# Patient Record
Sex: Female | Born: 2000 | Race: Black or African American | Hispanic: No | Smoking: Never smoker
Health system: Southern US, Community
[De-identification: ages and names within clinical notes are randomized; demographics above are authoritative.]

## PROBLEM LIST (undated history)

## (undated) DIAGNOSIS — Z789 Other specified health status: Secondary | ICD-10-CM

## (undated) HISTORY — PX: NO PAST SURGERIES: SHX2092

## (undated) HISTORY — DX: Other specified health status: Z78.9

---
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
## 2020-01-16 ENCOUNTER — Ambulatory Visit
Admission: EM | Admit: 2020-01-16 | Discharge: 2020-01-16 | Disposition: A | Discharge status: Home / Self Care | Payer: Medicaid Other | Attending: Physician Assistant | Admitting: Physician Assistant

## 2020-01-16 DIAGNOSIS — Z3200 Encounter for pregnancy test, result unknown: Secondary | ICD-10-CM | POA: Diagnosis not present

## 2020-01-16 DIAGNOSIS — N898 Other specified noninflammatory disorders of vagina: Secondary | ICD-10-CM

## 2020-01-16 DIAGNOSIS — R102 Pelvic and perineal pain: Secondary | ICD-10-CM | POA: Insufficient documentation

## 2020-01-16 LAB — POCT URINE PREGNANCY: Preg Test, Ur: NEGATIVE

## 2020-01-16 MED ORDER — METRONIDAZOLE 0.75 % VA GEL: 1.0000 | Freq: Every day | VAGINAL | 0 refills | Status: DC

## 2020-01-16 NOTE — ED Triage Notes (Signed)
Pt c/o white vaginal discharge with odor x2 days. Requesting STD and yeast testing. States has had un protective intercourse. Denies urinary sx's.

## 2020-01-16 NOTE — Discharge Instructions (Signed)
You were treated empirically for BV, start metrogel as directed. Cytology sent, you will be contacted with any positive results that requires further treatment. Refrain from sexual activity for the next 7 days. Monitor for any worsening of symptoms, fever, abdominal pain, nausea, vomiting, to follow up for reevaluation.

## 2020-01-16 NOTE — ED Provider Notes (Signed)
[name]    CSN: [id number] Arrival date & time: 01/16/20  1320      History   Chief Complaint Chief Complaint  Patient presents with  . SEXUALLY TRANSMITTED DISEASE    HPI Jennifer Escobar is a 19 y.o. female.   19 year old female 2 day history of low abdominal cramping, vaginal discharge with odor. Abdominal cramping is to the suprapubic area, intermittent. No vaginal itching. Denies urinary symptoms such as frequency, dysuria, hematuria. Denies fever, chills, body aches. Denies nausea, vomiting. IUD and do not have cycles. Sexually active 1 female, no condom use. No changes in body wash.      Past Medical History:  Diagnosis Date  . Antepartum fetal growth retardation 04/23/2019  . IUGR (intrauterine growth restriction) affecting care of mother 04/18/2019  . Medical history non-contributory   . Rubella non-immune status, antepartum 11/01/2018   MMR pp  . Supervision of high risk pregnancy, antepartum 10/31/2018    Nursing Staff Provider Office Location  [facility]  Dating   LMP Language  ENGLISH  Anatomy US  12/20/2018-WNL Flu Vaccine  Declined 02/13/2019 Genetic Screen  NIPS: ordered   AFP:     TDaP vaccine   02/13/2019 Hgb A1C or  GTT Early  Third trimester WNL Rhogam     LAB RESULTS  Feeding Plan Undecided, ?breast Blood Type B/Positive/-- (06/24 1058)  Contraception LnIUD PP Antibody Negative (06/24 1058) Cir  . SVD (spontaneous vaginal delivery) 04/24/2019    Patient Active Problem List   Diagnosis Date Noted  . IUD (intrauterine device) in place 04/24/2019    Past Surgical History:  Procedure Laterality Date  . NO PAST SURGERIES      OB History    Gravida  1   Para  1   Term  1   Preterm      AB      Living  1     SAB      TAB      Ectopic      Multiple  0   Live Births  1            Home Medications    Prior to Admission medications   Medication Sig Start Date End Date Taking? Authorizing Provider  Levonorgestrel (LILETTA, 52 MG,  IU) by Intrauterine route. 04/24/19   [provider]  metroNIDAZOLE (METROGEL VAGINAL) 0.75 % vaginal gel Place 1 Applicatorful vaginally at bedtime. 01/16/20   Ok Edwards, PA-C    Family History History reviewed. No pertinent family history.  Social History Social History   Tobacco Use  . Smoking status: Never Smoker  . Smokeless tobacco: Never Used  Vaping Use  . Vaping Use: Never used  Substance Use Topics  . Alcohol use: Never  . Drug use: Never     Allergies   Patient has no known allergies.   Review of Systems Review of Systems  Reason unable to perform ROS: See HPI as above.     Physical Exam Triage Vital Signs ED Triage Vitals [01/16/20 1538]  Enc Vitals Group     BP 119/76     Pulse Rate 73     Resp 18     Temp 97.8 F (36.6 C)     Temp Source Oral     SpO2 100 %     Weight      Height      Head Circumference      Peak Flow  Pain Score 2     Pain Loc      Pain Edu?      Excl. in [place]?    No data found.  Updated Vital Signs BP 119/76 (BP Location: Left Arm)   Pulse 73   Temp 97.8 F (36.6 C) (Oral)   Resp 18   SpO2 100%   Breastfeeding No   Physical Exam Constitutional:      General: She is not in acute distress.    Appearance: She is well-developed. She is not ill-appearing, toxic-appearing or diaphoretic.  HENT:     Head: Normocephalic and atraumatic.  Eyes:     Conjunctiva/sclera: Conjunctivae normal.     Pupils: Pupils are equal, round, and reactive to light.  Cardiovascular:     Rate and Rhythm: Normal rate and regular rhythm.  Pulmonary:     Effort: Pulmonary effort is normal. No respiratory distress.     Comments: LCTAB Abdominal:     General: Bowel sounds are normal.     Palpations: Abdomen is soft.     Tenderness: There is no abdominal tenderness. There is no right CVA tenderness, left CVA tenderness, guarding or rebound.  Genitourinary:    Comments: Patient declined Musculoskeletal:     Cervical back: Normal  range of motion and neck supple.  Skin:    General: Skin is warm and dry.  Neurological:     Mental Status: She is alert and oriented to person, place, and time.  Psychiatric:        Behavior: Behavior normal.        Judgment: Judgment normal.      UC Treatments / Results  Labs (all labs ordered are listed, but only abnormal results are displayed) Labs Reviewed  POCT URINE PREGNANCY  CERVICOVAGINAL ANCILLARY ONLY    EKG   Radiology No results found.  Procedures Procedures (including critical care time)  Medications Ordered in UC Medications - No data to display  Initial Impression / Assessment and Plan / UC Course  I have reviewed the triage vital signs and the nursing notes.  Pertinent labs & imaging results that were available during my care of the patient were reviewed by me and considered in my medical decision making (see chart for details).    Patient was treated empirically for BV, metrogel as directed. abdomen soft, +BS, nontender to palpation. Patient still able to feel IUD string. declined GU exam. Cytology sent, patient will be contacted with any positive results that require additional treatment. Patient to refrain from sexual activity for the next 7 days. Return precautions given.   Final Clinical Impressions(s) / UC Diagnoses   Final diagnoses:  Vaginal discharge  Suprapubic cramping    ED Prescriptions    Medication Sig Dispense Auth. Provider   metroNIDAZOLE (METROGEL VAGINAL) 0.75 % vaginal gel Place 1 Applicatorful vaginally at bedtime. 50 g Ok Edwards, PA-C     PDMP not reviewed this encounter.   Ok Edwards, PA-C 01/16/20 [phone]

## 2020-01-17 ENCOUNTER — Ambulatory Visit
Admission: EM | Admit: 2020-01-17 | Discharge: 2020-01-17 | Disposition: A | Discharge status: Home / Self Care | Payer: Medicaid Other | Attending: Emergency Medicine | Admitting: Emergency Medicine

## 2020-01-17 ENCOUNTER — Other Ambulatory Visit: Payer: Self-pay

## 2020-01-17 ENCOUNTER — Telehealth (HOSPITAL_COMMUNITY): Payer: Self-pay | Admitting: Emergency Medicine

## 2020-01-17 DIAGNOSIS — A5602 Chlamydial vulvovaginitis: Secondary | ICD-10-CM | POA: Diagnosis not present

## 2020-01-17 LAB — CERVICOVAGINAL ANCILLARY ONLY

## 2020-01-17 NOTE — Telephone Encounter (Signed)
Received call from lab on patient, states her cervicovaginal swab was received with no fluid in the bottom and they are unable to run the test.  Patient called and made aware of the need to return for a recollect.

## 2020-01-17 NOTE — ED Triage Notes (Signed)
Pt here for reswab 

## 2020-01-20 LAB — CERVICOVAGINAL ANCILLARY ONLY
Bacterial Vaginitis (gardnerella): POSITIVE — AB
Candida Glabrata: NEGATIVE
Candida Vaginitis: NEGATIVE
Chlamydia: POSITIVE — AB
Comment: NEGATIVE
Comment: NEGATIVE
Comment: NEGATIVE
Comment: NEGATIVE
Comment: NEGATIVE
Comment: NORMAL
Neisseria Gonorrhea: NEGATIVE
Trichomonas: NEGATIVE

## 2020-01-21 ENCOUNTER — Telehealth (HOSPITAL_COMMUNITY): Payer: Self-pay

## 2020-01-21 MED ORDER — AZITHROMYCIN 250 MG PO TABS: 1000.0000 mg | ORAL_TABLET | Freq: Once | ORAL | 0 refills | Status: AC

## 2020-01-29 ENCOUNTER — Ambulatory Visit
Admission: EM | Admit: 2020-01-29 | Discharge: 2020-01-29 | Disposition: A | Discharge status: Home / Self Care | Payer: Medicaid Other | Attending: Emergency Medicine | Admitting: Emergency Medicine

## 2020-01-29 ENCOUNTER — Other Ambulatory Visit: Payer: Self-pay

## 2020-01-29 DIAGNOSIS — Z113 Encounter for screening for infections with a predominantly sexual mode of transmission: Secondary | ICD-10-CM | POA: Insufficient documentation

## 2020-01-29 DIAGNOSIS — R1030 Lower abdominal pain, unspecified: Secondary | ICD-10-CM | POA: Insufficient documentation

## 2020-01-29 LAB — POCT URINE PREGNANCY: Preg Test, Ur: NEGATIVE

## 2020-01-29 MED ORDER — NAPROXEN 500 MG PO TABS: 500.0000 mg | ORAL_TABLET | Freq: Two times a day (BID) | ORAL | 0 refills | Status: DC

## 2020-01-29 NOTE — ED Provider Notes (Signed)
[name]    CSN: [id number] Arrival date & time: 01/29/20  0071      History   Chief Complaint Chief Complaint  Patient presents with  . SEXUALLY TRANSMITTED DISEASE    cramping    HPI Jennifer Escobar is a 19 y.o. female presenting today for STD screening abdominal cramping.  Reports over the past 2 days has had abdominal pain in her lower abdomen.  Denies any other symptoms including urinary symptoms or vaginal symptoms.  Has IUD in place is not has not had menstrual cycle since placement.  Concerned about possible STD as cause of symptoms.  Denies nausea or vomiting.  Has been eating and drinking normally.  HPI  Past Medical History:  Diagnosis Date  . Antepartum fetal growth retardation 04/23/2019  . IUGR (intrauterine growth restriction) affecting care of mother 04/18/2019  . Medical history non-contributory   . Rubella non-immune status, antepartum 11/01/2018   MMR pp  . Supervision of high risk pregnancy, antepartum 10/31/2018    Nursing Staff Provider Office Location  [place]  Dating   LMP Language  ENGLISH  Anatomy US  12/20/2018-WNL Flu Vaccine  Declined 02/13/2019 Genetic Screen  NIPS: ordered   AFP:     TDaP vaccine   02/13/2019 Hgb A1C or  GTT Early  Third trimester WNL Rhogam     LAB RESULTS  Feeding Plan Undecided, ?breast Blood Type B/Positive/-- (06/24 1058)  Contraception LnIUD PP Antibody Negative (06/24 1058) Cir  . SVD (spontaneous vaginal delivery) 04/24/2019    Patient Active Problem List   Diagnosis Date Noted  . IUD (intrauterine device) in place 04/24/2019    Past Surgical History:  Procedure Laterality Date  . NO PAST SURGERIES      OB History    Gravida  1   Para  1   Term  1   Preterm      AB      Living  1     SAB      TAB      Ectopic      Multiple  0   Live Births  1            Home Medications    Prior to Admission medications   Medication Sig Start Date End Date Taking? Authorizing Provider    Levonorgestrel (LILETTA, 52 MG, IU) by Intrauterine route. 04/24/19   [provider]  metroNIDAZOLE (METROGEL VAGINAL) 0.75 % vaginal gel Place 1 Applicatorful vaginally at bedtime. 01/16/20   Tasia Catchings, Amy V, PA-C  naproxen (NAPROSYN) 500 MG tablet Take 1 tablet (500 mg total) by mouth 2 (two) times daily. 01/29/20   Burna Atlas, Elesa Hacker, PA-C    Family History Family History  Problem Relation Age of Onset  . Healthy Mother   . Healthy Father     Social History Social History   Tobacco Use  . Smoking status: Never Smoker  . Smokeless tobacco: Never Used  Vaping Use  . Vaping Use: Never used  Substance Use Topics  . Alcohol use: Never  . Drug use: Never     Allergies   Patient has no known allergies.   Review of Systems Review of Systems  Constitutional: Negative for fever.  Respiratory: Negative for shortness of breath.   Cardiovascular: Negative for chest pain.  Gastrointestinal: Positive for abdominal pain. Negative for diarrhea, nausea and vomiting.  Genitourinary: Negative for dysuria, flank pain, genital sores, hematuria, menstrual problem, vaginal bleeding, vaginal discharge and vaginal pain.  Musculoskeletal: Negative for back pain.  Skin: Negative for rash.  Neurological: Negative for dizziness, light-headedness and headaches.     Physical Exam Triage Vital Signs ED Triage Vitals  Enc Vitals Group     BP      Pulse      Resp      Temp      Temp src      SpO2      Weight      Height      Head Circumference      Peak Flow      Pain Score      Pain Loc      Pain Edu?      Excl. in [place]?    No data found.  Updated Vital Signs BP 101/73 (BP Location: Left Arm)   Pulse 83   Temp 98.1 F (36.7 C) (Oral)   Resp 17   SpO2 99%   Visual Acuity Right Eye Distance:   Left Eye Distance:   Bilateral Distance:    Right Eye Near:   Left Eye Near:    Bilateral Near:     Physical Exam Vitals and nursing note reviewed.  Constitutional:       Appearance: She is well-developed.     Comments: No acute distress  HENT:     Head: Normocephalic and atraumatic.     Nose: Nose normal.  Eyes:     Conjunctiva/sclera: Conjunctivae normal.  Cardiovascular:     Rate and Rhythm: Normal rate.  Pulmonary:     Effort: Pulmonary effort is normal. No respiratory distress.  Abdominal:     General: There is no distension.     Comments: Soft, nondistended, nontender to light and deep palpation throughout.entire abdomen  Musculoskeletal:        General: Normal range of motion.     Cervical back: Neck supple.  Skin:    General: Skin is warm and dry.  Neurological:     Mental Status: She is alert and oriented to person, place, and time.      UC Treatments / Results  Labs (all labs ordered are listed, but only abnormal results are displayed) Labs Reviewed  POCT URINE PREGNANCY  CERVICOVAGINAL ANCILLARY ONLY    EKG   Radiology No results found.  Procedures Procedures (including critical care time)  Medications Ordered in UC Medications - No data to display  Initial Impression / Assessment and Plan / UC Course  I have reviewed the triage vital signs and the nursing notes.  Pertinent labs & imaging results that were available during my care of the patient were reviewed by me and considered in my medical decision making (see chart for details).     Pregnancy test negative, vaginal swab pending for STD screening.  Naprosyn as needed for cramping in the meantime.  Deferring empiric treatment until results return.  Discussed strict return precautions. Patient verbalized understanding and is agreeable with plan.  Final Clinical Impressions(s) / UC Diagnoses   Final diagnoses:  Lower abdominal pain  Screen for STD (sexually transmitted disease)     Discharge Instructions     Naprosyn as needed for cramping  We are testing you for Gonorrhea, Chlamydia, Trichomonas, Yeast and Bacterial Vaginosis. We will call you if  anything is positive and let you know if you require any further treatment. Please inform partners of any positive results.   Please return if symptoms not improving with treatment, development of fever, nausea, vomiting, abdominal pain.  ED Prescriptions    Medication Sig Dispense Auth. Provider   naproxen (NAPROSYN) 500 MG tablet Take 1 tablet (500 mg total) by mouth 2 (two) times daily. 30 tablet Joyelle Siedlecki, [place] C, PA-C     PDMP not reviewed this encounter.   Janith Lima, [place] 01/29/20 [phone]

## 2020-01-29 NOTE — Discharge Instructions (Signed)
Naprosyn as needed for cramping  We are testing you for Gonorrhea, Chlamydia, Trichomonas, Yeast and Bacterial Vaginosis. We will call you if anything is positive and let you know if you require any further treatment. Please inform partners of any positive results.   Please return if symptoms not improving with treatment, development of fever, nausea, vomiting, abdominal pain.

## 2020-01-29 NOTE — ED Triage Notes (Signed)
Pt states she may have had an STD exposure and has been experiencing abnormal cramping x 2 days. Pt is aox4 and ambulatory.

## 2020-01-30 ENCOUNTER — Telehealth (HOSPITAL_COMMUNITY): Payer: Self-pay | Admitting: Emergency Medicine

## 2020-01-30 LAB — CERVICOVAGINAL ANCILLARY ONLY
Bacterial Vaginitis (gardnerella): POSITIVE — AB
Candida Glabrata: NEGATIVE
Candida Vaginitis: NEGATIVE
Chlamydia: POSITIVE — AB
Comment: NEGATIVE
Comment: NEGATIVE
Comment: NEGATIVE
Comment: NEGATIVE
Comment: NEGATIVE
Comment: NORMAL
Neisseria Gonorrhea: NEGATIVE
Trichomonas: NEGATIVE

## 2020-01-30 MED ORDER — AZITHROMYCIN 250 MG PO TABS: 1000.0000 mg | ORAL_TABLET | Freq: Once | ORAL | 0 refills | Status: AC

## 2020-01-30 MED ORDER — METRONIDAZOLE 500 MG PO TABS: 500.0000 mg | ORAL_TABLET | Freq: Two times a day (BID) | ORAL | 0 refills | Status: DC

## 2020-02-27 ENCOUNTER — Other Ambulatory Visit: Payer: Self-pay

## 2020-02-27 ENCOUNTER — Ambulatory Visit
Admission: EM | Admit: 2020-02-27 | Discharge: 2020-02-27 | Disposition: A | Discharge status: Home / Self Care | Payer: Medicaid Other | Attending: Physician Assistant | Admitting: Physician Assistant

## 2020-02-27 DIAGNOSIS — N898 Other specified noninflammatory disorders of vagina: Secondary | ICD-10-CM

## 2020-02-27 LAB — POCT URINE PREGNANCY: Preg Test, Ur: NEGATIVE

## 2020-02-27 NOTE — ED Triage Notes (Signed)
Pt c/o white creamy vaginal discharge x2-3 days. Denies odor. Pt requesting STD testing.

## 2020-02-27 NOTE — Discharge Instructions (Signed)
Cytology sent, you will be contacted with any positive results that requires further treatment. Refrain from sexual activity until testing results return. Monitor for any worsening of symptoms, fever, abdominal pain, nausea, vomiting, to follow up for reevaluation. 

## 2020-02-27 NOTE — ED Provider Notes (Signed)
[name]    CSN: [id number] Arrival date & time: 02/27/20  1510      History   Chief Complaint Chief Complaint  Patient presents with  . Vaginal Discharge    HPI Jennifer Escobar is a 19 y.o. female.   19 year old female comes in for 2-3 day history of vaginal discharge. Denies any itching, odor. Denies urinary symptoms such as frequency, dysuria, hematuria. Denies fever, chills, body aches. Has had some intermittent abdominal cramping without nausea/vomiting. Denies abdominal pain, nausea, vomiting. IUD no cycles. Sexually active with 1 female partner, no condom use.      Past Medical History:  Diagnosis Date  . Antepartum fetal growth retardation 04/23/2019  . IUGR (intrauterine growth restriction) affecting care of mother 04/18/2019  . Medical history non-contributory   . Rubella non-immune status, antepartum 11/01/2018   MMR pp  . Supervision of high risk pregnancy, antepartum 10/31/2018    Nursing Staff Provider Office Location  [place]  Dating   LMP Language  ENGLISH  Anatomy US  12/20/2018-WNL Flu Vaccine  Declined 02/13/2019 Genetic Screen  NIPS: ordered   AFP:     TDaP vaccine   02/13/2019 Hgb A1C or  GTT Early  Third trimester WNL Rhogam     LAB RESULTS  Feeding Plan Undecided, ?breast Blood Type B/Positive/-- (06/24 1058)  Contraception LnIUD PP Antibody Negative (06/24 1058) Cir  . SVD (spontaneous vaginal delivery) 04/24/2019    Patient Active Problem List   Diagnosis Date Noted  . IUD (intrauterine device) in place 04/24/2019    Past Surgical History:  Procedure Laterality Date  . NO PAST SURGERIES      OB History    Gravida  1   Para  1   Term  1   Preterm      AB      Living  1     SAB      TAB      Ectopic      Multiple  0   Live Births  1            Home Medications    Prior to Admission medications   Medication Sig Start Date End Date Taking? Authorizing Provider  Levonorgestrel (LILETTA, 52 MG, IU) by Intrauterine  route. 04/24/19   [provider]    Family History Family History  Problem Relation Age of Onset  . Healthy Mother   . Healthy Father     Social History Social History   Tobacco Use  . Smoking status: Never Smoker  . Smokeless tobacco: Never Used  Vaping Use  . Vaping Use: Never used  Substance Use Topics  . Alcohol use: Never  . Drug use: Never     Allergies   Patient has no known allergies.   Review of Systems Review of Systems  Reason unable to perform ROS: See HPI as above.     Physical Exam Triage Vital Signs ED Triage Vitals  Enc Vitals Group     BP 02/27/20 1518 112/65     Pulse Rate 02/27/20 1518 62     Resp 02/27/20 1518 18     Temp 02/27/20 1518 98.1 F (36.7 C)     Temp Source 02/27/20 1518 Oral     SpO2 02/27/20 1518 98 %     Weight --      Height --      Head Circumference --      Peak Flow --  Pain Score 02/27/20 1519 0     Pain Loc --      Pain Edu? --      Excl. in [place]? --    No data found.  Updated Vital Signs BP 112/65 (BP Location: Left Arm)   Pulse 62   Temp 98.1 F (36.7 C) (Oral)   Resp 18   SpO2 98%   Breastfeeding No   Physical Exam Constitutional:      General: She is not in acute distress.    Appearance: She is well-developed. She is not ill-appearing, toxic-appearing or diaphoretic.  HENT:     Head: Normocephalic and atraumatic.  Eyes:     Conjunctiva/sclera: Conjunctivae normal.     Pupils: Pupils are equal, round, and reactive to light.  Cardiovascular:     Rate and Rhythm: Normal rate and regular rhythm.  Pulmonary:     Effort: Pulmonary effort is normal. No respiratory distress.     Comments: LCTAB Abdominal:     General: Bowel sounds are normal.     Palpations: Abdomen is soft.     Tenderness: There is no abdominal tenderness. There is no right CVA tenderness, left CVA tenderness, guarding or rebound.  Musculoskeletal:     Cervical back: Normal range of motion and neck supple.  Skin:     General: Skin is warm and dry.  Neurological:     Mental Status: She is alert and oriented to person, place, and time.  Psychiatric:        Behavior: Behavior normal.        Judgment: Judgment normal.      UC Treatments / Results  Labs (all labs ordered are listed, but only abnormal results are displayed) Labs Reviewed  POCT URINE PREGNANCY  CERVICOVAGINAL ANCILLARY ONLY    EKG   Radiology No results found.  Procedures Procedures (including critical care time)  Medications Ordered in UC Medications - No data to display  Initial Impression / Assessment and Plan / UC Course  I have reviewed the triage vital signs and the nursing notes.  Pertinent labs & imaging results that were available during my care of the patient were reviewed by me and considered in my medical decision making (see chart for details).    Given no vaginal odor or itching, will await cytology results prior to treatment. Patient's abdomen soft, +BS, nontender to palpation, low suspicion for PID. Pelvic deferred for now, but discussed symptoms to return for pelvic exam. Cytology sent, patient will be contacted with any positive results that require additional treatment. Patient to refrain from sexual activity until testing results return. Return precautions given.   Final Clinical Impressions(s) / UC Diagnoses   Final diagnoses:  Vaginal discharge   ED Prescriptions    None     PDMP not reviewed this encounter.   Ok Edwards, PA-C 02/27/20 1604

## 2020-03-02 LAB — CERVICOVAGINAL ANCILLARY ONLY
Bacterial Vaginitis (gardnerella): NEGATIVE
Candida Glabrata: NEGATIVE
Candida Vaginitis: POSITIVE — AB
Chlamydia: POSITIVE — AB
Comment: NEGATIVE
Comment: NEGATIVE
Comment: NEGATIVE
Comment: NEGATIVE
Comment: NEGATIVE
Comment: NORMAL
Neisseria Gonorrhea: NEGATIVE
Trichomonas: NEGATIVE

## 2020-03-03 ENCOUNTER — Telehealth (HOSPITAL_COMMUNITY): Payer: Self-pay | Admitting: Emergency Medicine

## 2020-03-03 MED ORDER — AZITHROMYCIN 250 MG PO TABS: 1000.0000 mg | ORAL_TABLET | Freq: Once | ORAL | 0 refills | Status: AC

## 2020-03-03 MED ORDER — FLUCONAZOLE 150 MG PO TABS: 150.0000 mg | ORAL_TABLET | Freq: Once | ORAL | 0 refills | Status: AC

## 2020-04-08 ENCOUNTER — Other Ambulatory Visit: Payer: Self-pay

## 2020-04-08 ENCOUNTER — Ambulatory Visit
Admission: EM | Admit: 2020-04-08 | Discharge: 2020-04-08 | Disposition: A | Discharge status: Home / Self Care | Payer: Medicaid Other | Attending: Emergency Medicine | Admitting: Emergency Medicine

## 2020-04-08 DIAGNOSIS — Z113 Encounter for screening for infections with a predominantly sexual mode of transmission: Secondary | ICD-10-CM | POA: Insufficient documentation

## 2020-04-08 DIAGNOSIS — R1030 Lower abdominal pain, unspecified: Secondary | ICD-10-CM | POA: Diagnosis not present

## 2020-04-08 LAB — POCT URINE PREGNANCY: Preg Test, Ur: NEGATIVE

## 2020-04-08 LAB — CERVICOVAGINAL ANCILLARY ONLY
Bacterial Vaginitis (gardnerella): POSITIVE — AB
Candida Glabrata: NEGATIVE
Candida Vaginitis: POSITIVE — AB
Chlamydia: POSITIVE — AB
Comment: NEGATIVE
Comment: NEGATIVE
Comment: NEGATIVE
Comment: NEGATIVE
Comment: NEGATIVE
Comment: NORMAL
Neisseria Gonorrhea: NEGATIVE
Trichomonas: NEGATIVE

## 2020-04-08 LAB — POCT URINALYSIS DIP (MANUAL ENTRY)
Glucose, UA: 100 mg/dL — AB
Nitrite, UA: NEGATIVE
Protein Ur, POC: 30 mg/dL — AB
Spec Grav, UA: >=1.03 — AB (ref 1.010–1.025)
Urobilinogen, UA: 2 E.U./dL — AB
pH, UA: 6 (ref 5.0–8.0)

## 2020-04-08 MED ORDER — NAPROXEN 500 MG PO TABS: 500.0000 mg | ORAL_TABLET | Freq: Two times a day (BID) | ORAL | 0 refills | Status: DC

## 2020-04-08 NOTE — ED Provider Notes (Signed)
[name]    CSN: [id number] Arrival date & time: 04/08/20  0804      History   Chief Complaint Chief Complaint  Patient presents with  . Abdominal Pain    cramping x 2 episodes this week  . SEXUALLY TRANSMITTED DISEASE    unknown exposure    HPI Jennifer Escobar is a 19 y.o. female presenting today for evaluation of abdominal cramping.  Reports over the past 2 weeks she has had abdominal cramping.  Pain has been intermittent, more prominent last week, denies pain in current time.  Had 2 episodes of vomiting, but no persistent nausea.  Does not have regular menstrual cycles, has IUD in place.  Does feel vaginal discharge has been slightly off from normal.  Denies itching or irritation.  Denies urinary symptoms.  Would like to be screened for STDs and UTI.  Has had prior swabs positive for chlamydia multiple times, yeast and BV.  HPI  Past Medical History:  Diagnosis Date  . Antepartum fetal growth retardation 04/23/2019  . IUGR (intrauterine growth restriction) affecting care of mother 04/18/2019  . Medical history non-contributory   . Rubella non-immune status, antepartum 11/01/2018   MMR pp  . Supervision of high risk pregnancy, antepartum 10/31/2018    Nursing Staff Provider Office Location  [place]  Dating   LMP Language  ENGLISH  Anatomy US  12/20/2018-WNL Flu Vaccine  Declined 02/13/2019 Genetic Screen  NIPS: ordered   AFP:     TDaP vaccine   02/13/2019 Hgb A1C or  GTT Early  Third trimester WNL Rhogam     LAB RESULTS  Feeding Plan Undecided, ?breast Blood Type B/Positive/-- (06/24 1058)  Contraception LnIUD PP Antibody Negative (06/24 1058) Cir  . SVD (spontaneous vaginal delivery) 04/24/2019    Patient Active Problem List   Diagnosis Date Noted  . IUD (intrauterine device) in place 04/24/2019    Past Surgical History:  Procedure Laterality Date  . NO PAST SURGERIES      OB History    Gravida  1   Para  1   Term  1   Preterm      AB      Living  1      SAB      TAB      Ectopic      Multiple  0   Live Births  1            Home Medications    Prior to Admission medications   Medication Sig Start Date End Date Taking? Authorizing Provider  Levonorgestrel (LILETTA, 52 MG, IU) by Intrauterine route. 04/24/19   [provider]  naproxen (NAPROSYN) 500 MG tablet Take 1 tablet (500 mg total) by mouth 2 (two) times daily. 04/08/20   Pravin Perezperez, Elesa Hacker, PA-C    Family History Family History  Problem Relation Age of Onset  . Healthy Mother   . Healthy Father     Social History Social History   Tobacco Use  . Smoking status: Never Smoker  . Smokeless tobacco: Never Used  Vaping Use  . Vaping Use: Never used  Substance Use Topics  . Alcohol use: Never  . Drug use: Never     Allergies   Patient has no known allergies.   Review of Systems Review of Systems  Constitutional: Negative for fever.  Respiratory: Negative for shortness of breath.   Cardiovascular: Negative for chest pain.  Gastrointestinal: Positive for abdominal pain and vomiting. Negative for diarrhea  and nausea.  Genitourinary: Positive for vaginal discharge. Negative for dysuria, flank pain, genital sores, hematuria, menstrual problem, vaginal bleeding and vaginal pain.  Musculoskeletal: Negative for back pain.  Skin: Negative for rash.  Neurological: Negative for dizziness, light-headedness and headaches.     Physical Exam Triage Vital Signs ED Triage Vitals  Enc Vitals Group     BP      Pulse      Resp      Temp      Temp src      SpO2      Weight      Height      Head Circumference      Peak Flow      Pain Score      Pain Loc      Pain Edu?      Excl. in [place]?    No data found.  Updated Vital Signs BP 108/66 (BP Location: Left Arm)   Pulse 64   Temp 98 F (36.7 C) (Oral)   SpO2 96%   Visual Acuity Right Eye Distance:   Left Eye Distance:   Bilateral Distance:    Right Eye Near:   Left Eye Near:     Bilateral Near:     Physical Exam Vitals and nursing note reviewed.  Constitutional:      Appearance: She is well-developed.     Comments: No acute distress  HENT:     Head: Normocephalic and atraumatic.     Nose: Nose normal.  Eyes:     Conjunctiva/sclera: Conjunctivae normal.  Cardiovascular:     Rate and Rhythm: Normal rate and regular rhythm.  Pulmonary:     Effort: Pulmonary effort is normal. No respiratory distress.     Comments: Breathing comfortably at rest, CTABL, no wheezing, rales or other adventitious sounds auscultated Abdominal:     General: There is no distension.     Comments: Soft, nondistended, nontender light deep palpation throughout abdomen  Musculoskeletal:        General: Normal range of motion.     Cervical back: Neck supple.  Skin:    General: Skin is warm and dry.  Neurological:     Mental Status: She is alert and oriented to person, place, and time.      UC Treatments / Results  Labs (all labs ordered are listed, but only abnormal results are displayed) Labs Reviewed  POCT URINALYSIS DIP (MANUAL ENTRY) - Abnormal; Notable for the following components:      Result Value   Glucose, UA =100 (*)    Bilirubin, UA small (*)    Ketones, POC UA small (15) (*)    Spec Grav, UA >=1.030 (*)    Blood, UA trace-intact (*)    Protein Ur, POC =30 (*)    Urobilinogen, UA 2.0 (*)    Leukocytes, UA Trace (*)    All other components within normal limits  URINE CULTURE  POCT URINE PREGNANCY  CERVICOVAGINAL ANCILLARY ONLY    EKG   Radiology No results found.  Procedures Procedures (including critical care time)  Medications Ordered in UC Medications - No data to display  Initial Impression / Assessment and Plan / UC Course  I have reviewed the triage vital signs and the nursing notes.  Pertinent labs & imaging results that were available during my care of the patient were reviewed by me and considered in my medical decision making (see chart  for details).     Pregnancy test  negative, UA not highly suggestive of UTI, will send for culture to definitively rule out, vaginal swab pending.  Treating symptomatically with Naprosyn as needed for abdominal pain.  Will call with results of vaginal swab and provide treatment as needed.  Discussed strict return precautions. Patient verbalized understanding and is agreeable with plan.  Final Clinical Impressions(s) / UC Diagnoses   Final diagnoses:  Lower abdominal pain  Screen for STD (sexually transmitted disease)     Discharge Instructions     Vaginal swab pending, monitor my chart for results, we will call if anything is abnormal Naprosyn twice daily as needed for abdominal pain/cramping Follow-up if symptoms not improving or worsening    ED Prescriptions    Medication Sig Dispense Auth. Provider   naproxen (NAPROSYN) 500 MG tablet Take 1 tablet (500 mg total) by mouth 2 (two) times daily. 30 tablet Orlean Holtrop, [place] C, PA-C     PDMP not reviewed this encounter.   Janith Lima, [place] 04/08/20 [phone]

## 2020-04-08 NOTE — Discharge Instructions (Addendum)
Vaginal swab pending, monitor my chart for results, we will call if anything is abnormal Naprosyn twice daily as needed for abdominal pain/cramping Follow-up if symptoms not improving or worsening

## 2020-04-08 NOTE — ED Triage Notes (Signed)
Pt states she had 2 abdominal cramping episodes over the last week and would like to be tested for STI's and UTI at this time. Pt is aox4 and ambulatory.

## 2020-04-09 ENCOUNTER — Telehealth (HOSPITAL_COMMUNITY): Payer: Self-pay | Admitting: Emergency Medicine

## 2020-04-09 LAB — URINE CULTURE: Culture: <10000 — AB

## 2020-04-09 MED ORDER — METRONIDAZOLE 500 MG PO TABS: 500.0000 mg | ORAL_TABLET | Freq: Two times a day (BID) | ORAL | 0 refills | Status: DC

## 2020-04-09 MED ORDER — DOXYCYCLINE HYCLATE 100 MG PO CAPS: 100.0000 mg | ORAL_CAPSULE | Freq: Two times a day (BID) | ORAL | 0 refills | Status: AC

## 2020-04-09 MED ORDER — FLUCONAZOLE 150 MG PO TABS: 150.0000 mg | ORAL_TABLET | Freq: Once | ORAL | 0 refills | Status: AC

## 2020-06-01 ENCOUNTER — Encounter (HOSPITAL_COMMUNITY): Payer: Self-pay

## 2020-06-01 ENCOUNTER — Other Ambulatory Visit: Payer: Self-pay

## 2020-06-01 ENCOUNTER — Ambulatory Visit (HOSPITAL_COMMUNITY)
Admission: EM | Admit: 2020-06-01 | Discharge: 2020-06-01 | Disposition: A | Discharge status: Home / Self Care | Payer: Medicaid Other | Attending: Family Medicine | Admitting: Family Medicine

## 2020-06-01 DIAGNOSIS — R300 Dysuria: Secondary | ICD-10-CM

## 2020-06-01 DIAGNOSIS — N898 Other specified noninflammatory disorders of vagina: Secondary | ICD-10-CM

## 2020-06-01 NOTE — ED Triage Notes (Signed)
Pt presents with vaginal discharge, urgency and dysuria x 2 days. Pt denies abdominal and back pain. She states she has been having pain after intercourse.

## 2020-06-01 NOTE — Discharge Instructions (Signed)
We will send results through mychart as soon as they are available and treat based on results.

## 2020-06-01 NOTE — ED Provider Notes (Signed)
[place]    CSN: [id number] Arrival date & time: 06/01/20  9371      History   Chief Complaint Chief Complaint  Patient presents with  . UTI  . Dysuria  . Urinary Urgency  . Vaginal Discharge    HPI Jennifer Escobar is a 20 y.o. female.   Here today with 2 days of vaginal discharge, urinary urgency, pelvic pressure. Denies fever, chills, N/V, concern for pregnancy. Has a hx of chlamydia and other vaginal infections, wanting STI testing today. Has IUD in place. Not trying anything OTC for sxs.      Past Medical History:  Diagnosis Date  . Antepartum fetal growth retardation 04/23/2019  . IUGR (intrauterine growth restriction) affecting care of mother 04/18/2019  . Medical history non-contributory   . Rubella non-immune status, antepartum 11/01/2018   MMR pp  . Supervision of high risk pregnancy, antepartum 10/31/2018    Nursing Staff Provider Office Location  [place]  Dating   LMP Language  ENGLISH  Anatomy US  12/20/2018-WNL Flu Vaccine  Declined 02/13/2019 Genetic Screen  NIPS: ordered   AFP:     TDaP vaccine   02/13/2019 Hgb A1C or  GTT Early  Third trimester WNL Rhogam     LAB RESULTS  Feeding Plan Undecided, ?breast Blood Type B/Positive/-- (06/24 1058)  Contraception LnIUD PP Antibody Negative (06/24 1058) Cir  . SVD (spontaneous vaginal delivery) 04/24/2019    Patient Active Problem List   Diagnosis Date Noted  . IUD (intrauterine device) in place 04/24/2019    Past Surgical History:  Procedure Laterality Date  . NO PAST SURGERIES      OB History    Gravida  1   Para  1   Term  1   Preterm      AB      Living  1     SAB      IAB      Ectopic      Multiple  0   Live Births  1            Home Medications    Prior to Admission medications   Medication Sig Start Date End Date Taking? Authorizing Provider  Levonorgestrel (LILETTA, 52 MG, IU) by Intrauterine route. 04/24/19   [provider]  metroNIDAZOLE (FLAGYL) 500 MG  tablet Take 1 tablet (500 mg total) by mouth 2 (two) times daily. 04/09/20   Lamptey, Myrene Galas, MD  naproxen (NAPROSYN) 500 MG tablet Take 1 tablet (500 mg total) by mouth 2 (two) times daily. 04/08/20   Wieters, Elesa Hacker, PA-C    Family History Family History  Problem Relation Age of Onset  . Healthy Mother   . Healthy Father     Social History Social History   Tobacco Use  . Smoking status: Never Smoker  . Smokeless tobacco: Never Used  Vaping Use  . Vaping Use: Never used  Substance Use Topics  . Alcohol use: Never  . Drug use: Never     Allergies   Patient has no known allergies.   Review of Systems Review of Systems PER HPI   Physical Exam Triage Vital Signs ED Triage Vitals  Enc Vitals Group     BP 06/01/20 0823 103/69     Pulse Rate 06/01/20 0823 94     Resp 06/01/20 0823 18     Temp 06/01/20 0823 98.5 F (36.9 C)     Temp Source 06/01/20 0823 Oral  SpO2 06/01/20 0823 100 %     Weight --      Height --      Head Circumference --      Peak Flow --      Pain Score 06/01/20 0822 0     Pain Loc --      Pain Edu? --      Excl. in [place]? --    No data found.  Updated Vital Signs BP 103/69 (BP Location: Left Arm)   Pulse 94   Temp 98.5 F (36.9 C) (Oral)   Resp 18   SpO2 100%   Visual Acuity Right Eye Distance:   Left Eye Distance:   Bilateral Distance:    Right Eye Near:   Left Eye Near:    Bilateral Near:     Physical Exam Vitals and nursing note reviewed.  Constitutional:      Appearance: Normal appearance. She is not ill-appearing.  HENT:     Head: Atraumatic.  Eyes:     Extraocular Movements: Extraocular movements intact.     Conjunctiva/sclera: Conjunctivae normal.  Cardiovascular:     Rate and Rhythm: Normal rate and regular rhythm.     Heart sounds: Normal heart sounds.  Pulmonary:     Effort: Pulmonary effort is normal.     Breath sounds: Normal breath sounds.  Abdominal:     General: Bowel sounds are normal. There is no  distension.     Palpations: Abdomen is soft.     Tenderness: There is no abdominal tenderness. There is no right CVA tenderness, left CVA tenderness or guarding.  Genitourinary:    Comments: GU exam deferred, self swab performed Musculoskeletal:        General: Normal range of motion.     Cervical back: Normal range of motion and neck supple.  Skin:    General: Skin is warm and dry.  Neurological:     Mental Status: She is alert and oriented to person, place, and time.  Psychiatric:        Mood and Affect: Mood normal.        Thought Content: Thought content normal.        Judgment: Judgment normal.      UC Treatments / Results  Labs (all labs ordered are listed, but only abnormal results are displayed) Labs Reviewed  URINE CULTURE  POCT URINALYSIS DIPSTICK, ED / UC  CERVICOVAGINAL ANCILLARY ONLY    EKG   Radiology No results found.  Procedures Procedures (including critical care time)  Medications Ordered in UC Medications - No data to display  Initial Impression / Assessment and Plan / UC Course  I have reviewed the triage vital signs and the nursing notes.  Pertinent labs & imaging results that were available during my care of the patient were reviewed by me and considered in my medical decision making (see chart for details).     U/A too cloudy to accurately read, will send out for urine culture and aptima swab pending. Vitals and exam reassuring. Will treat based on test results. OTC remedies and supportive care reviewed in meantime. Return for worsening sxs.   Final Clinical Impressions(s) / UC Diagnoses   Final diagnoses:  Vaginal discharge  Dysuria     Discharge Instructions     We will send results through mychart as soon as they are available and treat based on results.     ED Prescriptions    None     PDMP not reviewed this  encounter.   Volney American, [place] 06/01/20 1034

## 2020-06-02 ENCOUNTER — Telehealth: Payer: Medicaid Other | Admitting: Emergency Medicine

## 2020-06-02 DIAGNOSIS — R102 Pelvic and perineal pain: Secondary | ICD-10-CM

## 2020-06-02 LAB — URINE CULTURE: Culture: 20000 — AB

## 2020-06-02 NOTE — Progress Notes (Signed)
I see that you had a visit yesterday for this complaint.  You will need to wait for your test results to return.  The urgent care you went to will call in antibiotics for you based on the results.  Because we do not have the results, I cannot appropriately diagnose or treat your condition through an e-visit.   NOTE: If you entered your credit card information for this eVisit, you will not be charged. You may see a "hold" on your card for the $35 but that hold will drop off and you will not have a charge processed.   If you are having a true medical emergency please call 911.      For an urgent face to face visit, [place] has five urgent care centers for your convenience:     [facility] at [facility] Directions [phone] [address] [place], [place] [address] . 10 am - 6pm Monday - Friday    [facility]) Get Driving Directions [phone] [address] [place], [place] [address] . 10 am to 8 pm Monday-Friday . 12 pm to 8 pm [facility]gent Care at [facility] Get Driving Directions [phone] [address] [place], [place] [address] . 8 am to 8 pm Monday-Friday . 9 am to 6 pm Saturday . 11 am to 6 pm Sunday     [facility] at [facility] Get Driving Directions  [phone] [address] [place], [place] [address] . 8 am to 8 pm Monday-Friday . 8 am to 4 pm [facility] Urgent Care at [facility] Directions [phone] [address] [place], [place] [address] . 12 pm to 6 pm Monday-Friday      Your e-visit answers were reviewed by a board certified advanced clinical practitioner to complete your personal care plan.  Thank you for using e-Visits.    Approximately 5 minutes was used in reviewing the patient's chart, questionnaire, prescribing medications, and  documentation.

## 2020-06-03 ENCOUNTER — Other Ambulatory Visit: Payer: Self-pay

## 2020-06-03 ENCOUNTER — Encounter (HOSPITAL_COMMUNITY): Payer: Self-pay | Admitting: Medical Oncology

## 2020-06-03 ENCOUNTER — Ambulatory Visit (HOSPITAL_COMMUNITY)
Admission: EM | Admit: 2020-06-03 | Discharge: 2020-06-03 | Disposition: A | Discharge status: Home / Self Care | Payer: Medicaid Other | Attending: Internal Medicine | Admitting: Internal Medicine

## 2020-06-03 ENCOUNTER — Telehealth (HOSPITAL_COMMUNITY): Payer: Self-pay | Admitting: Emergency Medicine

## 2020-06-03 LAB — CERVICOVAGINAL ANCILLARY ONLY
Bacterial Vaginitis (gardnerella): POSITIVE — AB
Candida Glabrata: NEGATIVE
Candida Vaginitis: POSITIVE — AB
Chlamydia: POSITIVE — AB
Comment: NEGATIVE
Comment: NEGATIVE
Comment: NEGATIVE
Comment: NEGATIVE
Comment: NEGATIVE
Comment: NORMAL
Neisseria Gonorrhea: POSITIVE — AB
Trichomonas: NEGATIVE

## 2020-06-03 MED ORDER — FLUCONAZOLE 150 MG PO TABS: 150.0000 mg | ORAL_TABLET | Freq: Every day | ORAL | 0 refills | Status: DC

## 2020-06-03 MED ORDER — DOXYCYCLINE HYCLATE 100 MG PO CAPS: 100.0000 mg | ORAL_CAPSULE | Freq: Two times a day (BID) | ORAL | 0 refills | Status: AC

## 2020-06-03 MED ORDER — CEFTRIAXONE SODIUM 500 MG IJ SOLR
500.0000 mg | Freq: Once | INTRAMUSCULAR | Status: AC
  Administered 2020-06-03: 500 mg via INTRAMUSCULAR

## 2020-06-03 MED ORDER — CEFTRIAXONE SODIUM 500 MG IJ SOLR
INTRAMUSCULAR | Status: AC
  Filled 2020-06-03: qty 500

## 2020-06-03 MED ORDER — CEFTRIAXONE SODIUM 500 MG IJ SOLR: 500.0000 mg | Freq: Once | INTRAMUSCULAR | Status: DC

## 2020-06-03 MED ORDER — LIDOCAINE HCL (PF) 1 % IJ SOLN
INTRAMUSCULAR | Status: AC
  Filled 2020-06-03: qty 2

## 2020-06-03 MED ORDER — METRONIDAZOLE 500 MG PO TABS: 500.0000 mg | ORAL_TABLET | Freq: Two times a day (BID) | ORAL | 0 refills | Status: DC

## 2020-06-03 NOTE — ED Notes (Signed)
Patient had difficulty with shot administration due to anxiety, which lead to a long stay.  Patient able to tolerate shot with this RN and Dr. Leonides Grills present.

## 2020-06-03 NOTE — ED Triage Notes (Signed)
Patient returned to [facility] for IM Rocephin for positive Gonorrhea

## 2020-12-14 ENCOUNTER — Ambulatory Visit
Admission: EM | Admit: 2020-12-14 | Discharge: 2020-12-14 | Disposition: A | Discharge status: Home / Self Care | Payer: Medicaid Other | Attending: Internal Medicine | Admitting: Internal Medicine

## 2020-12-14 ENCOUNTER — Encounter: Payer: Self-pay | Admitting: Emergency Medicine

## 2020-12-14 ENCOUNTER — Other Ambulatory Visit: Payer: Self-pay

## 2020-12-14 DIAGNOSIS — N3001 Acute cystitis with hematuria: Secondary | ICD-10-CM | POA: Diagnosis present

## 2020-12-14 DIAGNOSIS — R300 Dysuria: Secondary | ICD-10-CM | POA: Insufficient documentation

## 2020-12-14 LAB — POCT URINALYSIS DIP (MANUAL ENTRY)
Glucose, UA: NEGATIVE mg/dL
Nitrite, UA: NEGATIVE
Protein Ur, POC: 100 mg/dL — AB
Spec Grav, UA: >=1.03 — AB (ref 1.010–1.025)
Urobilinogen, UA: 0.2 E.U./dL
pH, UA: 6 (ref 5.0–8.0)

## 2020-12-14 LAB — POCT URINE PREGNANCY: Preg Test, Ur: NEGATIVE

## 2020-12-14 MED ORDER — NITROFURANTOIN MONOHYD MACRO 100 MG PO CAPS: 100.0000 mg | ORAL_CAPSULE | Freq: Two times a day (BID) | ORAL | 0 refills | Status: AC

## 2020-12-14 NOTE — ED Triage Notes (Signed)
Pt here for vaginal irritation x 3 days; pt sts some burning with urination

## 2020-12-14 NOTE — ED Provider Notes (Signed)
[name]    CSN: [id number] Arrival date & time: 12/14/20  0812      History   Chief Complaint Chief Complaint  Patient presents with   Vaginal Itching    HPI Jennifer Escobar is a 20 y.o. female.   Patient presents to the urgent care with vaginal itching and irritation that has been present for 2 days.  Denies any urinary burning or frequency.  Denies any low back pain, fever, abdominal pain or pelvic pain.  Denies any known exposure to STD.  Denies having any unprotected sexual intercourse recently.   Vaginal Itching   Past Medical History:  Diagnosis Date   Antepartum fetal growth retardation 04/23/2019   IUGR (intrauterine growth restriction) affecting care of mother 04/18/2019   Medical history non-contributory    Rubella non-immune status, antepartum 11/01/2018   MMR pp   Supervision of high risk pregnancy, antepartum 10/31/2018    Nursing Staff Provider Office Location  [place]  Dating   LMP Language  ENGLISH  Anatomy US  12/20/2018-WNL Flu Vaccine  Declined 02/13/2019 Genetic Screen  NIPS: ordered   AFP:     TDaP vaccine   02/13/2019 Hgb A1C or  GTT Early  Third trimester WNL Rhogam     LAB RESULTS  Feeding Plan Undecided, ?breast Blood Type B/Positive/-- (06/24 1058)  Contraception LnIUD PP Antibody Negative (06/24 1058) Cir   SVD (spontaneous vaginal delivery) 04/24/2019    Patient Active Problem List   Diagnosis Date Noted   IUD (intrauterine device) in place 04/24/2019    Past Surgical History:  Procedure Laterality Date   NO PAST SURGERIES      OB History     Gravida  1   Para  1   Term  1   Preterm      AB      Living  1      SAB      IAB      Ectopic      Multiple  0   Live Births  1            Home Medications    Prior to Admission medications   Medication Sig Start Date End Date Taking? Authorizing Provider  nitrofurantoin, macrocrystal-monohydrate, (MACROBID) 100 MG capsule Take 1 capsule (100 mg total) by mouth 2  (two) times daily for 7 days. 12/14/20 12/21/20 Yes Odis Luster, FNP  fluconazole (DIFLUCAN) 150 MG tablet Take 1 tablet (150 mg total) by mouth daily. Repeat in 3 days if symptoms persist Patient not taking: Reported on 12/14/2020 06/03/20   Chase Picket, MD  Levonorgestrel (LILETTA, 52 MG, IU) by Intrauterine route. 04/24/19   [provider]  metroNIDAZOLE (FLAGYL) 500 MG tablet Take 1 tablet (500 mg total) by mouth 2 (two) times daily. Patient not taking: Reported on 12/14/2020 06/03/20   Chase Picket, MD  naproxen (NAPROSYN) 500 MG tablet Take 1 tablet (500 mg total) by mouth 2 (two) times daily. 04/08/20   Wieters, Elesa Hacker, PA-C    Family History Family History  Problem Relation Age of Onset   Healthy Mother    Healthy Father     Social History Social History   Tobacco Use   Smoking status: Never   Smokeless tobacco: Never  Vaping Use   Vaping Use: Never used  Substance Use Topics   Alcohol use: Never   Drug use: Never     Allergies   Patient has no known allergies.  Review of Systems Review of Systems Per HPI  Physical Exam Triage Vital Signs ED Triage Vitals [12/14/20 0820]  Enc Vitals Group     BP 117/66     Pulse Rate 64     Resp 18     Temp 98.2 F (36.8 C)     Temp Source Oral     SpO2 95 %     Weight      Height      Head Circumference      Peak Flow      Pain Score 3     Pain Loc      Pain Edu?      Excl. in [place]?    No data found.  Updated Vital Signs BP 117/66 (BP Location: Left Arm)   Pulse 64   Temp 98.2 F (36.8 C) (Oral)   Resp 18   SpO2 95%   Visual Acuity Right Eye Distance:   Left Eye Distance:   Bilateral Distance:    Right Eye Near:   Left Eye Near:    Bilateral Near:     Physical Exam Constitutional:      Appearance: Normal appearance.  HENT:     Head: Normocephalic and atraumatic.  Eyes:     Extraocular Movements: Extraocular movements intact.     Conjunctiva/sclera: Conjunctivae normal.   Cardiovascular:     Rate and Rhythm: Normal rate and regular rhythm.     Pulses: Normal pulses.     Heart sounds: Normal heart sounds.  Pulmonary:     Effort: Pulmonary effort is normal.     Breath sounds: Normal breath sounds.  Genitourinary:    Comments: Deferred with shared decision making.  Self swab performed. Neurological:     General: No focal deficit present.     Mental Status: She is alert and oriented to person, place, and time. Mental status is at baseline.  Psychiatric:        Mood and Affect: Mood normal.        Behavior: Behavior normal.        Thought Content: Thought content normal.        Judgment: Judgment normal.     UC Treatments / Results  Labs (all labs ordered are listed, but only abnormal results are displayed) Labs Reviewed  POCT URINALYSIS DIP (MANUAL ENTRY) - Abnormal; Notable for the following components:      Result Value   Clarity, UA cloudy (*)    Bilirubin, UA small (*)    Ketones, POC UA trace (5) (*)    Spec Grav, UA >=1.030 (*)    Blood, UA large (*)    Protein Ur, POC =100 (*)    Leukocytes, UA Large (3+) (*)    All other components within normal limits  URINE CULTURE  POCT URINE PREGNANCY  CERVICOVAGINAL ANCILLARY ONLY    EKG   Radiology No results found.  Procedures Procedures (including critical care time)  Medications Ordered in UC Medications - No data to display  Initial Impression / Assessment and Plan / UC Course  I have reviewed the triage vital signs and the nursing notes.  Pertinent labs & imaging results that were available during my care of the patient were reviewed by me and considered in my medical decision making (see chart for details).     Urinalysis showing large amounts of white blood cells that could be indicative of urinary tract infection.  Urine culture and STD vaginal swab are pending. Will treat  for urinary tract infection at this time with Macrobid antibiotic x7 days.  Advised patient to go to  the hospital if any pelvic, pain lower abdominal pain, or fever develop. Will change treatment if STD vaginal swab is positive for any infection. Discussed strict return precautions. Patient verbalized understanding and is agreeable with plan.  Final Clinical Impressions(s) / UC Diagnoses   Final diagnoses:  Acute cystitis with hematuria  Dysuria     Discharge Instructions      Your urinalysis shows signs of urinary tract infection.  You have been prescribed Macrobid antibiotic to treat this.  Your STD vaginal swab and urine culture are pending.  We will call if these are positive.  Refrain from any sexual activity until these results are complete.     ED Prescriptions     Medication Sig Dispense Auth. Provider   nitrofurantoin, macrocrystal-monohydrate, (MACROBID) 100 MG capsule Take 1 capsule (100 mg total) by mouth 2 (two) times daily for 7 days. 14 capsule Odis Luster, FNP      PDMP not reviewed this encounter.   Odis Luster, [place] 12/14/20 [phone]

## 2020-12-14 NOTE — Discharge Instructions (Addendum)
Your urinalysis shows signs of urinary tract infection.  You have been prescribed Macrobid antibiotic to treat this.  Your STD vaginal swab and urine culture are pending.  We will call if these are positive.  Refrain from any sexual activity until these results are complete.

## 2020-12-15 LAB — CERVICOVAGINAL ANCILLARY ONLY
Bacterial Vaginitis (gardnerella): POSITIVE — AB
Candida Glabrata: NEGATIVE
Candida Vaginitis: NEGATIVE
Chlamydia: NEGATIVE
Comment: NEGATIVE
Comment: NEGATIVE
Comment: NEGATIVE
Comment: NEGATIVE
Comment: NEGATIVE
Comment: NORMAL
Neisseria Gonorrhea: NEGATIVE
Trichomonas: NEGATIVE

## 2020-12-16 LAB — URINE CULTURE: Culture: >=100000 — AB

## 2020-12-17 ENCOUNTER — Telehealth (HOSPITAL_COMMUNITY): Payer: Self-pay | Admitting: Emergency Medicine

## 2020-12-17 MED ORDER — METRONIDAZOLE 500 MG PO TABS: 500.0000 mg | ORAL_TABLET | Freq: Two times a day (BID) | ORAL | 0 refills | Status: DC

## 2021-01-26 ENCOUNTER — Encounter: Payer: Self-pay | Admitting: Emergency Medicine

## 2021-01-26 ENCOUNTER — Other Ambulatory Visit: Payer: Self-pay

## 2021-01-26 ENCOUNTER — Ambulatory Visit
Admission: EM | Admit: 2021-01-26 | Discharge: 2021-01-26 | Disposition: A | Discharge status: Home / Self Care | Payer: Medicaid Other | Attending: Internal Medicine | Admitting: Internal Medicine

## 2021-01-26 DIAGNOSIS — N898 Other specified noninflammatory disorders of vagina: Secondary | ICD-10-CM | POA: Insufficient documentation

## 2021-01-26 DIAGNOSIS — N3001 Acute cystitis with hematuria: Secondary | ICD-10-CM | POA: Insufficient documentation

## 2021-01-26 DIAGNOSIS — N3000 Acute cystitis without hematuria: Secondary | ICD-10-CM | POA: Diagnosis present

## 2021-01-26 DIAGNOSIS — Z3202 Encounter for pregnancy test, result negative: Secondary | ICD-10-CM | POA: Diagnosis not present

## 2021-01-26 DIAGNOSIS — Z113 Encounter for screening for infections with a predominantly sexual mode of transmission: Secondary | ICD-10-CM | POA: Insufficient documentation

## 2021-01-26 LAB — POCT URINALYSIS DIP (MANUAL ENTRY)
Bilirubin, UA: NEGATIVE
Glucose, UA: NEGATIVE mg/dL
Ketones, POC UA: NEGATIVE mg/dL
Nitrite, UA: NEGATIVE
Protein Ur, POC: NEGATIVE mg/dL
Spec Grav, UA: 1.025 (ref 1.010–1.025)
Urobilinogen, UA: 0.2 E.U./dL
pH, UA: 6 (ref 5.0–8.0)

## 2021-01-26 LAB — POCT URINE PREGNANCY: Preg Test, Ur: NEGATIVE

## 2021-01-26 MED ORDER — FLUCONAZOLE 150 MG PO TABS: 150.0000 mg | ORAL_TABLET | Freq: Every day | ORAL | 0 refills | Status: DC

## 2021-01-26 MED ORDER — AMOXICILLIN-POT CLAVULANATE 875-125 MG PO TABS: 1.0000 | ORAL_TABLET | Freq: Two times a day (BID) | ORAL | 0 refills | Status: DC

## 2021-01-26 NOTE — Discharge Instructions (Signed)
Your urine did show signs of infection.  We will treat with Augmentin antibiotic.  Please take this medication with food.  You have also been prescribed Diflucan to take for possible yeast infection.  Please go ahead and take this medication.  Vaginal swab and urine culture are pending.  We will call if there are any abnormalities on these test.

## 2021-01-26 NOTE — ED Triage Notes (Signed)
White clumpy vaginal discharge x 1 week, mildly itching, denies burning. Denies abdominal pain, fever, chance of pregnancy or recent unprotected sex. Requesting STD check as well.

## 2021-01-26 NOTE — ED Provider Notes (Signed)
Jennifer Escobar    CSN: [id number] Arrival date & time: 01/26/21  0801      History   Chief Complaint Chief Complaint  Patient presents with   Vaginal Discharge    HPI Jennifer Escobar is a 20 y.o. female.   Patient presents with 1 week history of white, thick vaginal discharge.  Also has some vaginal itching.  Denies any urinary burning, urinary frequency, pelvic pain, abdominal pain, fever, back pain.  Denies any known exposure to STD and has not had any unprotected sexual intercourse recently.  Although, patient is requesting STD check to be sure.  Last menstrual cycle is unknown as patient states she does not have regular periods due to having an IUD in place.   Vaginal Discharge  Past Medical History:  Diagnosis Date   Antepartum fetal growth retardation 04/23/2019   IUGR (intrauterine growth restriction) affecting Escobar of mother 04/18/2019   Medical history non-contributory    Rubella non-immune status, antepartum 11/01/2018   MMR pp   Supervision of high risk pregnancy, antepartum 10/31/2018    Nursing Staff Provider Office Location  [place]  Dating   LMP Language  ENGLISH  Anatomy US  12/20/2018-WNL Flu Vaccine  Declined 02/13/2019 Genetic Screen  NIPS: ordered   AFP:     TDaP vaccine   02/13/2019 Hgb A1C or  GTT Early  Third trimester WNL Rhogam     LAB RESULTS  Feeding Plan Undecided, ?breast Blood Type B/Positive/-- (06/24 1058)  Contraception LnIUD PP Antibody Negative (06/24 1058) Cir   SVD (spontaneous vaginal delivery) 04/24/2019    Patient Active Problem List   Diagnosis Date Noted   IUD (intrauterine device) in place 04/24/2019    Past Surgical History:  Procedure Laterality Date   NO PAST SURGERIES      OB History     Gravida  1   Para  1   Term  1   Preterm      AB      Living  1      SAB      IAB      Ectopic      Multiple  0   Live Births  1            Home Medications    Prior to Admission medications   Medication Sig  Start Date End Date Taking? Authorizing Provider  amoxicillin-clavulanate (AUGMENTIN) 875-125 MG tablet Take 1 tablet by mouth every 12 (twelve) hours. 01/26/21  Yes Odis Luster, FNP  fluconazole (DIFLUCAN) 150 MG tablet Take 1 tablet (150 mg total) by mouth daily. 01/26/21  Yes Odis Luster, FNP  Levonorgestrel (LILETTA, 52 MG, IU) by Intrauterine route. 04/24/19   [provider]  metroNIDAZOLE (FLAGYL) 500 MG tablet Take 1 tablet (500 mg total) by mouth 2 (two) times daily. 12/17/20   Lamptey, Myrene Galas, MD  naproxen (NAPROSYN) 500 MG tablet Take 1 tablet (500 mg total) by mouth 2 (two) times daily. 04/08/20   Wieters, Elesa Hacker, PA-C    Family History Family History  Problem Relation Age of Onset   Healthy Mother    Healthy Father     Social History Social History   Tobacco Use   Smoking status: Never   Smokeless tobacco: Never  Vaping Use   Vaping Use: Never used  Substance Use Topics   Alcohol use: Never   Drug use: Never     Allergies   Patient has no known allergies.  Review of Systems Review of Systems Per HPI  Physical Exam Triage Vital Signs ED Triage Vitals  Enc Vitals Group     BP 01/26/21 0807 107/70     Pulse Rate 01/26/21 0807 64     Resp 01/26/21 0807 16     Temp 01/26/21 0807 98.2 F (36.8 C)     Temp Source 01/26/21 0807 Oral     SpO2 01/26/21 0807 98 %     Weight --      Height --      Head Circumference --      Peak Flow --      Pain Score 01/26/21 0808 0     Pain Loc --      Pain Edu? --      Excl. in [place]? --    No data found.  Updated Vital Signs BP 107/70 (BP Location: Left Arm)   Pulse 64   Temp 98.2 F (36.8 C) (Oral)   Resp 16   SpO2 98%   Visual Acuity Right Eye Distance:   Left Eye Distance:   Bilateral Distance:    Right Eye Near:   Left Eye Near:    Bilateral Near:     Physical Exam Constitutional:      Appearance: Normal appearance.  HENT:     Head: Normocephalic and atraumatic.  Eyes:      Extraocular Movements: Extraocular movements intact.     Conjunctiva/sclera: Conjunctivae normal.  Cardiovascular:     Rate and Rhythm: Normal rate and regular rhythm.     Pulses: Normal pulses.     Heart sounds: Normal heart sounds.  Pulmonary:     Effort: Pulmonary effort is normal. No respiratory distress.     Breath sounds: Normal breath sounds.  Abdominal:     General: Abdomen is flat. Bowel sounds are normal. There is no distension.     Palpations: Abdomen is soft.     Tenderness: There is no abdominal tenderness.  Genitourinary:    Comments: Deferred with shared decision making.  Self swab performed. Skin:    General: Skin is warm and dry.  Neurological:     General: No focal deficit present.     Mental Status: She is alert and oriented to person, place, and time. Mental status is at baseline.  Psychiatric:        Mood and Affect: Mood normal.        Behavior: Behavior normal.        Thought Content: Thought content normal.        Judgment: Judgment normal.     UC Treatments / Results  Labs (all labs ordered are listed, but only abnormal results are displayed) Labs Reviewed  POCT URINALYSIS DIP (MANUAL ENTRY) - Abnormal; Notable for the following components:      Result Value   Clarity, UA hazy (*)    Blood, UA small (*)    Leukocytes, UA Small (1+) (*)    All other components within normal limits  URINE CULTURE  POCT URINE PREGNANCY  CERVICOVAGINAL ANCILLARY ONLY    EKG   Radiology No results found.  Procedures Procedures (including critical Escobar time)  Medications Ordered in UC Medications - No data to display  Initial Impression / Assessment and Plan / UC Course  I have reviewed the triage vital signs and the nursing notes.  Pertinent labs & imaging results that were available during my Escobar of the patient were reviewed by me and considered in my medical  decision making (see chart for details).     Urinalysis showing signs of urinary tract  infection.  Patient had visit in August with similar symptoms and urine culture showed E. coli.  Therefore, will treat with Augmentin antibiotic to treat urinary tract infection.  Urine culture is pending.  Vaginal swab is also pending.  Will add or change treatment if necessary once test results are complete.  Thick, white vaginal discharge is also indicative of vaginal yeast.  Diflucan prescribed as well.  Advised patient to refrain from sexual activity until test results are complete.Discussed strict return precautions. Patient verbalized understanding and is agreeable with plan.  Final Clinical Impressions(s) / UC Diagnoses   Final diagnoses:  Vaginal discharge  Screening examination for venereal disease  Acute cystitis without hematuria  Pregnancy test negative     Discharge Instructions      Your urine did show signs of infection.  We will treat with Augmentin antibiotic.  Please take this medication with food.  You have also been prescribed Diflucan to take for possible yeast infection.  Please go ahead and take this medication.  Vaginal swab and urine culture are pending.  We will call if there are any abnormalities on these test.     ED Prescriptions     Medication Sig Dispense Auth. Provider   fluconazole (DIFLUCAN) 150 MG tablet Take 1 tablet (150 mg total) by mouth daily. 1 tablet Odis Luster, FNP   amoxicillin-clavulanate (AUGMENTIN) 875-125 MG tablet Take 1 tablet by mouth every 12 (twelve) hours. 14 tablet Odis Luster, FNP      PDMP not reviewed this encounter.   Odis Luster, [place] 01/26/21 [phone]

## 2021-01-27 ENCOUNTER — Telehealth (HOSPITAL_COMMUNITY): Payer: Self-pay | Admitting: Emergency Medicine

## 2021-01-27 LAB — CERVICOVAGINAL ANCILLARY ONLY
Bacterial Vaginitis (gardnerella): POSITIVE — AB
Candida Glabrata: NEGATIVE
Candida Vaginitis: NEGATIVE
Chlamydia: NEGATIVE
Comment: NEGATIVE
Comment: NEGATIVE
Comment: NEGATIVE
Comment: NEGATIVE
Comment: NEGATIVE
Comment: NORMAL
Neisseria Gonorrhea: NEGATIVE
Trichomonas: NEGATIVE

## 2021-01-27 LAB — URINE CULTURE: Culture: NO GROWTH

## 2021-01-27 MED ORDER — METRONIDAZOLE 500 MG PO TABS: 500.0000 mg | ORAL_TABLET | Freq: Two times a day (BID) | ORAL | 0 refills | Status: DC

## 2021-05-12 ENCOUNTER — Other Ambulatory Visit: Payer: Self-pay

## 2021-05-12 ENCOUNTER — Ambulatory Visit
Admission: EM | Admit: 2021-05-12 | Discharge: 2021-05-12 | Disposition: A | Discharge status: Home / Self Care | Payer: Medicaid Other | Attending: Internal Medicine | Admitting: Internal Medicine

## 2021-05-12 DIAGNOSIS — Z113 Encounter for screening for infections with a predominantly sexual mode of transmission: Secondary | ICD-10-CM | POA: Insufficient documentation

## 2021-05-12 DIAGNOSIS — N390 Urinary tract infection, site not specified: Secondary | ICD-10-CM | POA: Diagnosis present

## 2021-05-12 DIAGNOSIS — N898 Other specified noninflammatory disorders of vagina: Secondary | ICD-10-CM | POA: Diagnosis not present

## 2021-05-12 LAB — POCT URINALYSIS DIP (MANUAL ENTRY)
Bilirubin, UA: NEGATIVE
Glucose, UA: NEGATIVE mg/dL
Ketones, POC UA: NEGATIVE mg/dL
Nitrite, UA: NEGATIVE
Protein Ur, POC: NEGATIVE mg/dL
Spec Grav, UA: 1.02 (ref 1.010–1.025)
Urobilinogen, UA: 0.2 E.U./dL
pH, UA: 7.5 (ref 5.0–8.0)

## 2021-05-12 MED ORDER — NITROFURANTOIN MONOHYD MACRO 100 MG PO CAPS: 100.0000 mg | ORAL_CAPSULE | Freq: Two times a day (BID) | ORAL | 0 refills | Status: DC

## 2021-05-12 NOTE — Discharge Instructions (Signed)
Your urine is showing possible signs of urinary tract infection so you are being treated with Macrobid antibiotic.  Your vaginal swab and urine culture are pending.  We will call if these are positive and treat as appropriate.  Please refrain from sexual activity until test results and treatment are complete.

## 2021-05-12 NOTE — ED Triage Notes (Signed)
Pt is presenting for STD testing. 3 day h/o vaginal discharge and irritation. Denies vaginal odor, abdominal, and dysuria. No known STD exposures.

## 2021-05-12 NOTE — ED Provider Notes (Signed)
°[name] ° ° ° °CSN: [id number] °Arrival date & time: 05/12/21  0859 ° ° °  ° °History   °Chief Complaint °Chief Complaint  °Patient presents with  ° Vaginal Discharge  ° ° °HPI °Jennifer Escobar is a 21 y.o. female.  ° °Patient presents with vaginal discharge and vaginal irritation that has been present for approximately 3 days.  Denies urinary burning, urinary frequency, hematuria, irregular vaginal bleeding, pelvic pain, abdominal pain, fever, back pain.  Denies any known exposure to STD but has had unprotected sexual intercourse recently.  Denies chance of pregnancy.  Reports that she has IUD in place.  Last menstrual cycle was sometime in December but patient is not sure exact date.  Patient requesting urinalysis to check for urinary tract infection as well. ° ° °Vaginal Discharge ° °Past Medical History:  °Diagnosis Date  ° Antepartum fetal growth retardation 04/23/2019  ° IUGR (intrauterine growth restriction) affecting care of mother 04/18/2019  ° Medical history non-contributory   ° Rubella non-immune status, antepartum 11/01/2018  ° MMR pp  ° Supervision of high risk pregnancy, antepartum 10/31/2018  °  Nursing Staff Provider Office Location  [place]  Dating   LMP Language  ENGLISH  Anatomy US  12/20/2018-WNL Flu Vaccine  Declined 02/13/2019 Genetic Screen  NIPS: ordered   AFP:     TDaP vaccine   02/13/2019 Hgb A1C or  GTT Early  Third trimester WNL Rhogam     LAB RESULTS  Feeding Plan Undecided, ?breast Blood Type B/Positive/-- (06/24 1058)  Contraception LnIUD PP Antibody Negative (06/24 1058) Cir  ° SVD (spontaneous vaginal delivery) 04/24/2019  ° ° °Patient Active Problem List  ° Diagnosis Date Noted  ° IUD (intrauterine device) in place 04/24/2019  ° ° °Past Surgical History:  °Procedure Laterality Date  ° NO PAST SURGERIES    ° ° °OB History   ° ° Gravida  °1  ° Para  °1  ° Term  °1  ° Preterm  °   ° AB  °   ° Living  °1  °  ° ° SAB  °   ° IAB  °   ° Ectopic  °   ° Multiple  °0  ° Live Births  °1   °   °  °  ° ° ° °Home Medications   ° °Prior to Admission medications   °Medication Sig Start Date End Date Taking? Authorizing Provider  °nitrofurantoin, macrocrystal-monohydrate, (MACROBID) 100 MG capsule Take 1 capsule (100 mg total) by mouth 2 (two) times daily. 05/12/21  Yes Mound, Haley E, FNP  °fluconazole (DIFLUCAN) 150 MG tablet Take 1 tablet (150 mg total) by mouth daily. 01/26/21   Mound, Haley E, FNP  °Levonorgestrel (LILETTA, 52 MG, IU) by Intrauterine route. 04/24/19   [provider]  °metroNIDAZOLE (FLAGYL) 500 MG tablet Take 1 tablet (500 mg total) by mouth 2 (two) times daily. 01/27/21   Lamptey, Philip O, MD  °naproxen (NAPROSYN) 500 MG tablet Take 1 tablet (500 mg total) by mouth 2 (two) times daily. 04/08/20   Wieters, Hallie C, PA-C  ° ° °Family History °Family History  °Problem Relation Age of Onset  ° Healthy Mother   ° Healthy Father   ° ° °Social History °Social History  ° °Tobacco Use  ° Smoking status: Never  ° Smokeless tobacco: Never  °Vaping Use  ° Vaping Use: Never used  °Substance Use Topics  ° Alcohol use: Never  ° Drug use: Never  ° ° ° °  Allergies   °Patient has no known allergies. ° ° °Review of Systems °Review of Systems °Per HPI ° °Physical Exam °Triage Vital Signs °ED Triage Vitals  °Enc Vitals Group  °   BP 05/12/21 0906 113/76  °   Pulse Rate 05/12/21 0906 82  °   Resp 05/12/21 0906 18  °   Temp 05/12/21 0906 98.1 °F (36.7 °C)  °   Temp Source 05/12/21 0906 Oral  °   SpO2 05/12/21 0906 98 %  °   Weight --   °   Height --   °   Head Circumference --   °   Peak Flow --   °   Pain Score 05/12/21 0907 0  °   Pain Loc --   °   Pain Edu? --   °   Excl. in [place]? --   ° °No data found. ° °Updated Vital Signs °BP 113/76 (BP Location: Left Arm)    Pulse 82    Temp 98.1 °F (36.7 °C) (Oral)    Resp 18    SpO2 98%  ° °Visual Acuity °Right Eye Distance:   °Left Eye Distance:   °Bilateral Distance:   ° °Right Eye Near:   °Left Eye Near:    °Bilateral Near:    ° °Physical  Exam °Constitutional:   °   General: She is not in acute distress. °   Appearance: Normal appearance. She is not toxic-appearing or diaphoretic.  °HENT:  °   Head: Normocephalic and atraumatic.  °Eyes:  °   Extraocular Movements: Extraocular movements intact.  °   Conjunctiva/sclera: Conjunctivae normal.  °Cardiovascular:  °   Rate and Rhythm: Normal rate and regular rhythm.  °   Pulses: Normal pulses.  °   Heart sounds: Normal heart sounds.  °Pulmonary:  °   Effort: Pulmonary effort is normal. No respiratory distress.  °   Breath sounds: Normal breath sounds.  °Abdominal:  °   General: Abdomen is flat. Bowel sounds are normal. There is no distension.  °   Tenderness: There is no abdominal tenderness.  °Genitourinary: °   Comments: Deferred with shared decision making.  Self swab performed. °Neurological:  °   General: No focal deficit present.  °   Mental Status: She is alert and oriented to person, place, and time. Mental status is at baseline.  °Psychiatric:     °   Mood and Affect: Mood normal.     °   Behavior: Behavior normal.     °   Thought Content: Thought content normal.     °   Judgment: Judgment normal.  ° ° ° °UC Treatments / Results  °Labs °(all labs ordered are listed, but only abnormal results are displayed) °Labs Reviewed  °POCT URINALYSIS DIP (MANUAL ENTRY) - Abnormal; Notable for the following components:  °    Result Value  ° Clarity, UA cloudy (*)   ° Blood, UA trace-intact (*)   ° Leukocytes, UA Large (3+) (*)   ° All other components within normal limits  °URINE CULTURE  °CERVICOVAGINAL ANCILLARY ONLY  ° ° °EKG ° ° °Radiology °No results found. ° °Procedures °Procedures (including critical care time) ° °Medications Ordered in UC °Medications - No data to display ° °Initial Impression / Assessment and Plan / UC Course  °I have reviewed the triage vital signs and the nursing notes. ° °Pertinent labs & imaging results that were available during my care of the patient were reviewed   by me and  considered in my medical decision making (see chart for details).     Patient requested urinalysis even though it may not have been warranted given vaginal discharge is main symptom.  Although, patient did have large amount of leukocytes on urinalysis.  After further review of the chart, patient had leukocytes on urinalysis with negative urine culture and positive vaginal swab for bacterial vaginosis.  Highly suspect that bacterial vaginosis could be cause of patient's symptoms.  Although, there is a significantly more amount of leukocytes than previous visit so will opt to treat for urinary tract infection with Macrobid while awaiting cervicovaginal swab.  Cervicovaginal swab and urine culture pending.  Will change or add treatment if needed once test results are complete.  Discussed return precautions.  Patient verbalized understanding and was agreeable with plan. Final Clinical Impressions(s) / UC Diagnoses   Final diagnoses:  Vaginal discharge  Lower urinary tract infection  Screening examination for venereal disease     Discharge Instructions      Your urine is showing possible signs of urinary tract infection so you are being treated with Macrobid antibiotic.  Your vaginal swab and urine culture are pending.  We will call if these are positive and treat as appropriate.  Please refrain from sexual activity until test results and treatment are complete.    ED Prescriptions     Medication Sig Dispense Auth. Provider   nitrofurantoin, macrocrystal-monohydrate, (MACROBID) 100 MG capsule Take 1 capsule (100 mg total) by mouth 2 (two) times daily. 10 capsule Teodora Medici, [place]      PDMP not reviewed this encounter.   Teodora Medici, [place] 05/12/21 [phone]

## 2021-05-13 LAB — CERVICOVAGINAL ANCILLARY ONLY
Bacterial Vaginitis (gardnerella): POSITIVE — AB
Candida Glabrata: NEGATIVE
Candida Vaginitis: POSITIVE — AB
Chlamydia: NEGATIVE
Comment: NEGATIVE
Comment: NEGATIVE
Comment: NEGATIVE
Comment: NEGATIVE
Comment: NEGATIVE
Comment: NORMAL
Neisseria Gonorrhea: NEGATIVE
Trichomonas: NEGATIVE

## 2021-05-14 ENCOUNTER — Telehealth (HOSPITAL_COMMUNITY): Payer: Self-pay | Admitting: Emergency Medicine

## 2021-05-14 LAB — URINE CULTURE

## 2021-05-14 MED ORDER — METRONIDAZOLE 500 MG PO TABS: 500.0000 mg | ORAL_TABLET | Freq: Two times a day (BID) | ORAL | 0 refills | Status: DC

## 2021-05-14 MED ORDER — FLUCONAZOLE 150 MG PO TABS: 150.0000 mg | ORAL_TABLET | Freq: Once | ORAL | 0 refills | Status: AC

## 2021-10-06 ENCOUNTER — Ambulatory Visit
Admission: EM | Admit: 2021-10-06 | Discharge: 2021-10-06 | Disposition: A | Discharge status: Home / Self Care | Payer: BC Managed Care – PPO | Attending: Urgent Care | Admitting: Urgent Care

## 2021-10-06 DIAGNOSIS — Z113 Encounter for screening for infections with a predominantly sexual mode of transmission: Secondary | ICD-10-CM

## 2021-10-06 DIAGNOSIS — R590 Localized enlarged lymph nodes: Secondary | ICD-10-CM

## 2021-10-06 MED ORDER — CEPHALEXIN 500 MG PO CAPS
500.0000 mg | ORAL_CAPSULE | Freq: Four times a day (QID) | ORAL | 0 refills | Status: DC
Start: 2021-10-06 — End: 2022-06-13

## 2021-10-06 NOTE — ED Triage Notes (Signed)
Pt present STD screening, pt denies any symptoms states she just want to be safe then sorry. Pt would like a full STD screening.

## 2021-10-06 NOTE — Discharge Instructions (Signed)
You were tested today for gonorrhea, chlamydia, trichomonas, BV, and yeast. You were also tested for syphillis and HIV. We will call you with the results of your test once received. Please avoid all forms of intercourse until test results received, and if positive for any STI, all partners will need to complete entire course of antibiotics prior to resuming. As always, practice safer sexual practices by using protection each and every time, and limiting number of partners.   In regards to your armpit - please stop your deodorant. Apply warm compresses to the area. We checked a CBC blood test today. Start keflex 4x/ day. Use ibuprofen as needed for the pain. If it persists, please follow up with PCP or RTC for recheck.

## 2021-10-06 NOTE — ED Provider Notes (Signed)
[name]    CSN: [id number] Arrival date & time: 10/06/21  1141      History   Chief Complaint Chief Complaint  Patient presents with   SEXUALLY TRANSMITTED DISEASE    HPI Jennifer Escobar is a 21 y.o. female.   Pleasant 21 year old female presents today with concern for STD testing.  She denies a known exposure, but is concerned she possibly was exposed to HIV.  Last sexual encounter was 3 weeks ago.  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  She denies any pelvic symptoms, discharge, dysuria.  She is requesting full testing for peace of mind. Additionally, over the past 2 to 3 weeks, patient has had swelling in her left axilla.  She states she believes it is related to a deodorant she is using.  She states the area "busted and drained" when she stopped using the deodorant, but once it resolved, started using the unit deodorant again.  She states the area is painful, but this time not draining.  It is only in the left axilla. No treatments tried.    Past Medical History:  Diagnosis Date   Antepartum fetal growth retardation 04/23/2019   IUGR (intrauterine growth restriction) affecting care of mother 04/18/2019   Medical history non-contributory    Rubella non-immune status, antepartum 11/01/2018   MMR pp   Supervision of high risk pregnancy, antepartum 10/31/2018    Nursing Staff Provider Office Location  [place]  Dating   LMP Language  ENGLISH  Anatomy US  12/20/2018-WNL Flu Vaccine  Declined 02/13/2019 Genetic Screen  NIPS: ordered   AFP:     TDaP vaccine   02/13/2019 Hgb A1C or  GTT Early  Third trimester WNL Rhogam     LAB RESULTS  Feeding Plan Undecided, ?breast Blood Type B/Positive/-- (06/24 1058)  Contraception LnIUD PP Antibody Negative (06/24 1058) Cir   SVD (spontaneous vaginal delivery) 04/24/2019    Patient Active Problem List   Diagnosis Date Noted   IUD (intrauterine device) in place 04/24/2019    Past Surgical History:   Procedure Laterality Date   NO PAST SURGERIES      OB History     Gravida  1   Para  1   Term  1   Preterm      AB      Living  1      SAB      IAB      Ectopic      Multiple  0   Live Births  1            Home Medications    Prior to Admission medications   Medication Sig Start Date End Date Taking? Authorizing Provider  cephALEXin (KEFLEX) 500 MG capsule Take 1 capsule (500 mg total) by mouth 4 (four) times daily. 10/06/21  Yes Athel Merriweather L, PA  Levonorgestrel (LILETTA, 52 MG, IU) by Intrauterine route. 04/24/19   [provider]  naproxen (NAPROSYN) 500 MG tablet Take 1 tablet (500 mg total) by mouth 2 (two) times daily. 04/08/20   Wieters, Elesa Hacker, PA-C    Family History Family History  Problem Relation Age of Onset   Healthy Mother    Healthy Father     Social History Social History   Tobacco Use   Smoking status: Never   Smokeless tobacco: Never  Vaping Use   Vaping Use: Never used  Substance Use Topics   Alcohol use: Never  Drug use: Never     Allergies   Patient has no known allergies.   Review of Systems Review of Systems As per HPI  Physical Exam Triage Vital Signs ED Triage Vitals [10/06/21 1218]  Enc Vitals Group     BP 117/80     Pulse Rate 91     Resp 18     Temp 98.6 F (37 C)     Temp Source Oral     SpO2 99 %     Weight      Height      Head Circumference      Peak Flow      Pain Score 0     Pain Loc      Pain Edu?      Excl. in [place]?    No data found.  Updated Vital Signs BP 117/80 (BP Location: Left Arm)   Pulse 91   Temp 98.6 F (37 C) (Oral)   Resp 18   SpO2 99%   Visual Acuity Right Eye Distance:   Left Eye Distance:   Bilateral Distance:    Right Eye Near:   Left Eye Near:    Bilateral Near:     Physical Exam Vitals and nursing note reviewed.  Constitutional:      General: She is not in acute distress.    Appearance: Normal appearance. She is well-developed and  normal weight. She is not ill-appearing, toxic-appearing or diaphoretic.  HENT:     Head: Normocephalic and atraumatic.     Right Ear: External ear normal.     Left Ear: External ear normal.     Nose: Nose normal.     Mouth/Throat:     Mouth: Mucous membranes are moist.  Eyes:     General: No scleral icterus.    Extraocular Movements: Extraocular movements intact.     Conjunctiva/sclera: Conjunctivae normal.     Pupils: Pupils are equal, round, and reactive to light.  Neck:     Comments: L axilla with a single painful palpable lymph node.  No appreciable abscess. No erythema to the skin Cardiovascular:     Rate and Rhythm: Normal rate.     Heart sounds: No murmur heard. Pulmonary:     Effort: Pulmonary effort is normal. No accessory muscle usage, respiratory distress or retractions.     Breath sounds: Normal air entry. No decreased air movement or transmitted upper airway sounds. No decreased breath sounds.  Musculoskeletal:        General: No swelling.     Cervical back: Normal range of motion.  Skin:    General: Skin is warm and dry.     Capillary Refill: Capillary refill takes less than 2 seconds.  Neurological:     Mental Status: She is alert.  Psychiatric:        Mood and Affect: Mood normal.     UC Treatments / Results  Labs (all labs ordered are listed, but only abnormal results are displayed) Labs Reviewed  RPR  HIV ANTIBODY (ROUTINE TESTING W REFLEX)  CBC WITH DIFFERENTIAL/PLATELET  CERVICOVAGINAL ANCILLARY ONLY    EKG   Radiology No results found.  Procedures Procedures (including critical care time)  Medications Ordered in UC Medications - No data to display  Initial Impression / Assessment and Plan / UC Course  I have reviewed the triage vital signs and the nursing notes.  Pertinent labs & imaging results that were available during my care of the patient were reviewed by  me and considered in my medical decision making (see chart for  details).     STD screen - pt asx. She is very concerned about a possible HIV exposure. Partner told her he was negative. Aptima swab and labs obtained today. Last exposure 3 weeks ago. Axillary lymphadenopathy - I do not appreciate a true abscess. Will start abx, recommended warm compresses to the area, and ibuprofen as needed for pain. CBC obtained for further eval. RTC precautions discussed  Final Clinical Impressions(s) / UC Diagnoses   Final diagnoses:  Screen for STD (sexually transmitted disease)  Axillary lymphadenopathy     Discharge Instructions      You were tested today for gonorrhea, chlamydia, trichomonas, BV, and yeast. You were also tested for syphillis and HIV. We will call you with the results of your test once received. Please avoid all forms of intercourse until test results received, and if positive for any STI, all partners will need to complete entire course of antibiotics prior to resuming. As always, practice safer sexual practices by using protection each and every time, and limiting number of partners.   In regards to your armpit - please stop your deodorant. Apply warm compresses to the area. We checked a CBC blood test today. Start keflex 4x/ day. Use ibuprofen as needed for the pain. If it persists, please follow up with PCP or RTC for recheck.      ED Prescriptions     Medication Sig Dispense Auth. Provider   cephALEXin (KEFLEX) 500 MG capsule Take 1 capsule (500 mg total) by mouth 4 (four) times daily. 20 capsule Prosper Paff L, PA      PDMP not reviewed this encounter.   Chaney Malling, [place] 10/06/21 1256

## 2021-10-07 ENCOUNTER — Telehealth (HOSPITAL_COMMUNITY): Payer: Self-pay | Admitting: Emergency Medicine

## 2021-10-07 LAB — CBC WITH DIFFERENTIAL/PLATELET
Basophils Absolute: 0 10*3/uL (ref 0.0–0.2)
Basos: 0 %
EOS (ABSOLUTE): 0.1 10*3/uL (ref 0.0–0.4)
Eos: 1 %
Hematocrit: 39.3 % (ref 34.0–46.6)
Hemoglobin: 12.8 g/dL (ref 11.1–15.9)
Immature Grans (Abs): 0 10*3/uL (ref 0.0–0.1)
Immature Granulocytes: 0 %
Lymphocytes Absolute: 1.6 10*3/uL (ref 0.7–3.1)
Lymphs: 21 %
MCH: 27 pg (ref 26.6–33.0)
MCHC: 32.6 g/dL (ref 31.5–35.7)
MCV: 83 fL (ref 79–97)
Monocytes Absolute: 0.5 10*3/uL (ref 0.1–0.9)
Monocytes: 6 %
Neutrophils Absolute: 5.5 10*3/uL (ref 1.4–7.0)
Neutrophils: 72 %
Platelets: 276 10*3/uL (ref 150–450)
RBC: 4.74 x10E6/uL (ref 3.77–5.28)
RDW: 12.7 % (ref 11.7–15.4)
WBC: 7.7 10*3/uL (ref 3.4–10.8)

## 2021-10-07 LAB — HIV ANTIBODY (ROUTINE TESTING W REFLEX): HIV Screen 4th Generation wRfx: NONREACTIVE

## 2021-10-07 LAB — CERVICOVAGINAL ANCILLARY ONLY
Bacterial Vaginitis (gardnerella): POSITIVE — AB
Candida Glabrata: NEGATIVE
Candida Vaginitis: NEGATIVE
Chlamydia: NEGATIVE
Comment: NEGATIVE
Comment: NEGATIVE
Comment: NEGATIVE
Comment: NEGATIVE
Comment: NEGATIVE
Comment: NORMAL
Neisseria Gonorrhea: NEGATIVE
Trichomonas: NEGATIVE

## 2021-10-07 LAB — RPR: RPR Ser Ql: NONREACTIVE

## 2021-10-07 MED ORDER — METRONIDAZOLE 500 MG PO TABS: 500.0000 mg | ORAL_TABLET | Freq: Two times a day (BID) | ORAL | 0 refills | Status: DC

## 2021-12-20 ENCOUNTER — Ambulatory Visit
Admission: EM | Admit: 2021-12-20 | Discharge: 2021-12-20 | Disposition: A | Discharge status: Home / Self Care | Payer: BC Managed Care – PPO | Attending: Physician Assistant | Admitting: Physician Assistant

## 2021-12-20 DIAGNOSIS — Z113 Encounter for screening for infections with a predominantly sexual mode of transmission: Secondary | ICD-10-CM | POA: Diagnosis present

## 2021-12-20 DIAGNOSIS — N760 Acute vaginitis: Secondary | ICD-10-CM | POA: Diagnosis not present

## 2021-12-20 DIAGNOSIS — B3731 Acute candidiasis of vulva and vagina: Secondary | ICD-10-CM | POA: Insufficient documentation

## 2021-12-20 NOTE — ED Provider Notes (Signed)
[name]    CSN: [id number] Arrival date & time: 12/20/21  8638      History   Chief Complaint Chief Complaint  Patient presents with   STD Testing     HPI Jennifer Escobar is a 21 y.o. female.   Patient here today for STD screening.  She denies any current symptoms including vaginal discharge, abdominal pain, nausea or vomiting.  She does not have any genital lesions.  She declines blood work today as she had screening with this a few months ago.  The history is provided by the patient.    Past Medical History:  Diagnosis Date   Antepartum fetal growth retardation 04/23/2019   IUGR (intrauterine growth restriction) affecting care of mother 04/18/2019   Medical history non-contributory    Rubella non-immune status, antepartum 11/01/2018   MMR pp   Supervision of high risk pregnancy, antepartum 10/31/2018    Nursing Staff Provider Office Location  [place]  Dating   LMP Language  ENGLISH  Anatomy US  12/20/2018-WNL Flu Vaccine  Declined 02/13/2019 Genetic Screen  NIPS: ordered   AFP:     TDaP vaccine   02/13/2019 Hgb A1C or  GTT Early  Third trimester WNL Rhogam     LAB RESULTS  Feeding Plan Undecided, ?breast Blood Type B/Positive/-- (06/24 1058)  Contraception LnIUD PP Antibody Negative (06/24 1058) Cir   SVD (spontaneous vaginal delivery) 04/24/2019    Patient Active Problem List   Diagnosis Date Noted   IUD (intrauterine device) in place 04/24/2019    Past Surgical History:  Procedure Laterality Date   NO PAST SURGERIES      OB History     Gravida  1   Para  1   Term  1   Preterm      AB      Living  1      SAB      IAB      Ectopic      Multiple  0   Live Births  1            Home Medications    Prior to Admission medications   Medication Sig Start Date End Date Taking? Authorizing Provider  cephALEXin (KEFLEX) 500 MG capsule Take 1 capsule (500 mg total) by mouth 4 (four) times daily. 10/06/21   Crain, Whitney L, PA   Levonorgestrel (LILETTA, 52 MG, IU) by Intrauterine route. 04/24/19   [provider]  metroNIDAZOLE (FLAGYL) 500 MG tablet Take 1 tablet (500 mg total) by mouth 2 (two) times daily. 10/07/21   Lamptey, Myrene Galas, MD  naproxen (NAPROSYN) 500 MG tablet Take 1 tablet (500 mg total) by mouth 2 (two) times daily. 04/08/20   Wieters, Elesa Hacker, PA-C    Family History Family History  Problem Relation Age of Onset   Healthy Mother    Healthy Father     Social History Social History   Tobacco Use   Smoking status: Never   Smokeless tobacco: Never  Vaping Use   Vaping Use: Never used  Substance Use Topics   Alcohol use: Never   Drug use: Never     Allergies   Patient has no known allergies.   Review of Systems Review of Systems  Constitutional:  Negative for chills and fever.  Eyes:  Negative for discharge and redness.  Gastrointestinal:  Negative for abdominal pain, nausea and vomiting.  Genitourinary:  Negative for genital sores and vaginal discharge.     Physical  Exam Triage Vital Signs ED Triage Vitals  Enc Vitals Group     BP      Pulse      Resp      Temp      Temp src      SpO2      Weight      Height      Head Circumference      Peak Flow      Pain Score      Pain Loc      Pain Edu?      Excl. in [place]?    No data found.  Updated Vital Signs BP 121/80 (BP Location: Left Arm)   Pulse 87   Temp 98.1 F (36.7 C) (Oral)   Resp 18   LMP  (LMP Unknown)   SpO2 98%      Physical Exam Vitals and nursing note reviewed.  Constitutional:      General: She is not in acute distress.    Appearance: Normal appearance. She is not ill-appearing.  HENT:     Head: Normocephalic and atraumatic.  Eyes:     Conjunctiva/sclera: Conjunctivae normal.  Cardiovascular:     Rate and Rhythm: Normal rate.  Pulmonary:     Effort: Pulmonary effort is normal. No respiratory distress.  Neurological:     Mental Status: She is alert.  Psychiatric:        Mood and  Affect: Mood normal.        Behavior: Behavior normal.        Thought Content: Thought content normal.      UC Treatments / Results  Labs (all labs ordered are listed, but only abnormal results are displayed) Labs Reviewed  CERVICOVAGINAL ANCILLARY ONLY    EKG   Radiology No results found.  Procedures Procedures (including critical care time)  Medications Ordered in UC Medications - No data to display  Initial Impression / Assessment and Plan / UC Course  I have reviewed the triage vital signs and the nursing notes.  Pertinent labs & imaging results that were available during my care of the patient were reviewed by me and considered in my medical decision making (see chart for details).    STD screening ordered.  Will await results further recommendation.  Encouraged follow-up with any further concerns.  Final Clinical Impressions(s) / UC Diagnoses   Final diagnoses:  Screening for STD (sexually transmitted disease)   Discharge Instructions   None    ED Prescriptions   None    PDMP not reviewed this encounter.   Francene Finders, PA-C 12/20/21 1054

## 2021-12-20 NOTE — ED Triage Notes (Signed)
Pt presents with STD testing with no known symptoms.

## 2021-12-21 LAB — CERVICOVAGINAL ANCILLARY ONLY
Bacterial Vaginitis (gardnerella): POSITIVE — AB
Candida Glabrata: NEGATIVE
Candida Vaginitis: POSITIVE — AB
Chlamydia: NEGATIVE
Comment: NEGATIVE
Comment: NEGATIVE
Comment: NEGATIVE
Comment: NEGATIVE
Comment: NEGATIVE
Comment: NORMAL
Neisseria Gonorrhea: NEGATIVE
Trichomonas: NEGATIVE

## 2021-12-22 ENCOUNTER — Telehealth (HOSPITAL_COMMUNITY): Payer: Self-pay | Admitting: Emergency Medicine

## 2021-12-22 MED ORDER — FLUCONAZOLE 150 MG PO TABS: 150.0000 mg | ORAL_TABLET | Freq: Once | ORAL | 0 refills | Status: AC

## 2021-12-22 MED ORDER — METRONIDAZOLE 500 MG PO TABS: 500.0000 mg | ORAL_TABLET | Freq: Two times a day (BID) | ORAL | 0 refills | Status: DC

## 2021-12-22 NOTE — Telephone Encounter (Signed)
Per protocol, patient will need treatment with Metronidazole for positive BV and Diflucan for yeast.   Attempted to reach patient x 1, VM is full Prescription sent to pharmacy on file

## 2021-12-23 NOTE — Telephone Encounter (Signed)
Results seen on mychart

## 2022-04-11 ENCOUNTER — Ambulatory Visit
Admission: EM | Admit: 2022-04-11 | Discharge: 2022-04-11 | Disposition: A | Discharge status: Home / Self Care | Payer: BC Managed Care – PPO | Attending: Physician Assistant | Admitting: Physician Assistant

## 2022-04-11 DIAGNOSIS — B3731 Acute candidiasis of vulva and vagina: Secondary | ICD-10-CM | POA: Insufficient documentation

## 2022-04-11 DIAGNOSIS — Z113 Encounter for screening for infections with a predominantly sexual mode of transmission: Secondary | ICD-10-CM | POA: Diagnosis not present

## 2022-04-11 DIAGNOSIS — B9689 Other specified bacterial agents as the cause of diseases classified elsewhere: Secondary | ICD-10-CM | POA: Insufficient documentation

## 2022-04-11 DIAGNOSIS — N760 Acute vaginitis: Secondary | ICD-10-CM | POA: Diagnosis not present

## 2022-04-11 DIAGNOSIS — N898 Other specified noninflammatory disorders of vagina: Secondary | ICD-10-CM | POA: Insufficient documentation

## 2022-04-11 MED ORDER — FLUCONAZOLE 150 MG PO TABS: 150.0000 mg | ORAL_TABLET | Freq: Every day | ORAL | 1 refills | Status: DC

## 2022-04-11 MED ORDER — METRONIDAZOLE 500 MG PO TABS: 500.0000 mg | ORAL_TABLET | Freq: Two times a day (BID) | ORAL | 0 refills | Status: DC

## 2022-04-11 NOTE — ED Provider Notes (Signed)
Jennifer Escobar    CSN: [id number] Arrival date & time: 04/11/22  0848      History   Chief Complaint Chief Complaint  Patient presents with   Vaginitis    HPI Jennifer Escobar is a 21 y.o. female.   Patient complains of a vaginal discharge she requests testing for STDs patient reports she feels like she has BV and a yeast infection.  Patient denies any fever or chills she does not have any any abdominal pain patient denies any burning with urination patient denies any pregnancy risk patient would like treatment for BV and yeast     Past Medical History:  Diagnosis Date   Antepartum fetal growth retardation 04/23/2019   IUGR (intrauterine growth restriction) affecting Escobar of mother 04/18/2019   Medical history non-contributory    Rubella non-immune status, antepartum 11/01/2018   MMR pp   Supervision of high risk pregnancy, antepartum 10/31/2018    Nursing Staff Provider Office Location  [place]  Dating   LMP Language  ENGLISH  Anatomy US  12/20/2018-WNL Flu Vaccine  Declined 02/13/2019 Genetic Screen  NIPS: ordered   AFP:     TDaP vaccine   02/13/2019 Hgb A1C or  GTT Early  Third trimester WNL Rhogam     LAB RESULTS  Feeding Plan Undecided, ?breast Blood Type B/Positive/-- (06/24 1058)  Contraception LnIUD PP Antibody Negative (06/24 1058) Cir   SVD (spontaneous vaginal delivery) 04/24/2019    Patient Active Problem List   Diagnosis Date Noted   IUD (intrauterine device) in place 04/24/2019    Past Surgical History:  Procedure Laterality Date   NO PAST SURGERIES      OB History     Gravida  1   Para  1   Term  1   Preterm      AB      Living  1      SAB      IAB      Ectopic      Multiple  0   Live Births  1            Home Medications    Prior to Admission medications   Medication Sig Start Date End Date Taking? Authorizing Provider  cephALEXin (KEFLEX) 500 MG capsule Take 1 capsule (500 mg total) by mouth 4 (four) times daily. 10/06/21    Crain, Whitney L, PA  Levonorgestrel (LILETTA, 52 MG, IU) by Intrauterine route. 04/24/19   [provider]  metroNIDAZOLE (FLAGYL) 500 MG tablet Take 1 tablet (500 mg total) by mouth 2 (two) times daily. 12/22/21   Lamptey, Myrene Galas, MD  naproxen (NAPROSYN) 500 MG tablet Take 1 tablet (500 mg total) by mouth 2 (two) times daily. 04/08/20   Wieters, Elesa Hacker, PA-C    Family History Family History  Problem Relation Age of Onset   Healthy Mother    Healthy Father     Social History Social History   Tobacco Use   Smoking status: Never   Smokeless tobacco: Never  Vaping Use   Vaping Use: Never used  Substance Use Topics   Alcohol use: Never   Drug use: Never     Allergies   Patient has no known allergies.   Review of Systems Review of Systems  All other systems reviewed and are negative.    Physical Exam Triage Vital Signs ED Triage Vitals  Enc Vitals Group     BP 04/11/22 0939 101/62     Pulse Rate  04/11/22 0939 69     Resp 04/11/22 0939 18     Temp 04/11/22 0939 98.1 F (36.7 C)     Temp Source 04/11/22 0939 Oral     SpO2 04/11/22 0939 100 %     Weight --      Height --      Head Circumference --      Peak Flow --      Pain Score 04/11/22 0941 4     Pain Loc --      Pain Edu? --      Excl. in [place]? --    No data found.  Updated Vital Signs BP 101/62 (BP Location: Left Arm)   Pulse 69   Temp 98.1 F (36.7 C) (Oral)   Resp 18   LMP  (LMP Unknown)   SpO2 100%   Visual Acuity Right Eye Distance:   Left Eye Distance:   Bilateral Distance:    Right Eye Near:   Left Eye Near:    Bilateral Near:     Physical Exam Vitals and nursing note reviewed.  Constitutional:      Appearance: She is well-developed.  HENT:     Head: Normocephalic.  Pulmonary:     Effort: Pulmonary effort is normal.  Abdominal:     General: There is no distension.  Musculoskeletal:        General: Normal range of motion.     Cervical back: Normal range of motion.   Neurological:     Mental Status: She is alert and oriented to person, place, and time.      UC Treatments / Results  Labs (all labs ordered are listed, but only abnormal results are displayed) Labs Reviewed - No data to display  EKG   Radiology No results found.  Procedures Procedures (including critical Escobar time)  Medications Ordered in UC Medications - No data to display  Initial Impression / Assessment and Plan / UC Course  I have reviewed the triage vital signs and the nursing notes.  Pertinent labs & imaging results that were available during my Escobar of the patient were reviewed by me and considered in my medical decision making (see chart for details).     Vaginal swab is obtained patient is given a prescription for metronidazole and Diflucan she is advised to return if any problems Final Clinical Impressions(s) / UC Diagnoses   Final diagnoses:  Vaginal discharge   Discharge Instructions   None    ED Prescriptions   None    PDMP not reviewed this encounter. An After Visit Summary was printed and given to the patient.       Jennifer Escobar, [place] 04/11/22 1206

## 2022-04-11 NOTE — ED Triage Notes (Signed)
Pt presents with vaginal irritation for past few days.

## 2022-04-12 LAB — CERVICOVAGINAL ANCILLARY ONLY
Bacterial Vaginitis (gardnerella): POSITIVE — AB
Candida Glabrata: POSITIVE — AB
Candida Vaginitis: NEGATIVE
Chlamydia: NEGATIVE
Comment: NEGATIVE
Comment: NEGATIVE
Comment: NEGATIVE
Comment: NEGATIVE
Comment: NEGATIVE
Comment: NORMAL
Neisseria Gonorrhea: NEGATIVE
Trichomonas: NEGATIVE

## 2022-06-13 ENCOUNTER — Emergency Department (HOSPITAL_COMMUNITY)
Admission: EM | Admit: 2022-06-13 | Discharge: 2022-06-13 | Disposition: A | Discharge status: Home / Self Care | Payer: Medicaid Other | Attending: Emergency Medicine | Admitting: Emergency Medicine

## 2022-06-13 ENCOUNTER — Encounter (HOSPITAL_COMMUNITY): Payer: Self-pay

## 2022-06-13 ENCOUNTER — Emergency Department (HOSPITAL_COMMUNITY): Payer: Medicaid Other

## 2022-06-13 ENCOUNTER — Other Ambulatory Visit: Payer: Self-pay

## 2022-06-13 DIAGNOSIS — R1031 Right lower quadrant pain: Secondary | ICD-10-CM | POA: Diagnosis present

## 2022-06-13 DIAGNOSIS — D72829 Elevated white blood cell count, unspecified: Secondary | ICD-10-CM | POA: Insufficient documentation

## 2022-06-13 DIAGNOSIS — N12 Tubulo-interstitial nephritis, not specified as acute or chronic: Secondary | ICD-10-CM | POA: Diagnosis not present

## 2022-06-13 DIAGNOSIS — E876 Hypokalemia: Secondary | ICD-10-CM | POA: Insufficient documentation

## 2022-06-13 DIAGNOSIS — R109 Unspecified abdominal pain: Secondary | ICD-10-CM

## 2022-06-13 LAB — URINALYSIS, ROUTINE W REFLEX MICROSCOPIC
Bilirubin Urine: NEGATIVE
Glucose, UA: NEGATIVE mg/dL
Ketones, ur: NEGATIVE mg/dL
Nitrite: NEGATIVE
Protein, ur: 100 mg/dL — AB
Specific Gravity, Urine: 1.008 (ref 1.005–1.030)
WBC, UA: >50 WBC/hpf (ref 0–5)
pH: 6 (ref 5.0–8.0)

## 2022-06-13 LAB — I-STAT BETA HCG BLOOD, ED (MC, WL, AP ONLY): I-stat hCG, quantitative: <5 m[IU]/mL (ref <5)

## 2022-06-13 LAB — CBC WITH DIFFERENTIAL/PLATELET
Abs Immature Granulocytes: 0.03 10*3/uL (ref 0.00–0.07)
Basophils Absolute: 0 10*3/uL (ref 0.0–0.1)
Basophils Relative: 0 %
Eosinophils Absolute: 0 10*3/uL (ref 0.0–0.5)
Eosinophils Relative: 0 %
HCT: 37.4 % (ref 36.0–46.0)
Hemoglobin: 12.6 g/dL (ref 12.0–15.0)
Immature Granulocytes: 0 %
Lymphocytes Relative: 10 %
Lymphs Abs: 1.2 10*3/uL (ref 0.7–4.0)
MCH: 28.5 pg (ref 26.0–34.0)
MCHC: 33.7 g/dL (ref 30.0–36.0)
MCV: 84.6 fL (ref 80.0–100.0)
Monocytes Absolute: 0.9 10*3/uL (ref 0.1–1.0)
Monocytes Relative: 8 %
Neutro Abs: 9.3 10*3/uL — ABNORMAL HIGH (ref 1.7–7.7)
Neutrophils Relative %: 82 %
Platelets: 212 10*3/uL (ref 150–400)
RBC: 4.42 MIL/uL (ref 3.87–5.11)
RDW: 13.4 % (ref 11.5–15.5)
WBC: 11.5 10*3/uL — ABNORMAL HIGH (ref 4.0–10.5)
nRBC: 0 % (ref 0.0–0.2)

## 2022-06-13 LAB — COMPREHENSIVE METABOLIC PANEL
ALT: 12 U/L (ref 0–44)
AST: 21 U/L (ref 15–41)
Albumin: 3.7 g/dL (ref 3.5–5.0)
Alkaline Phosphatase: 56 U/L (ref 38–126)
Anion gap: 12 (ref 5–15)
BUN: 7 mg/dL (ref 6–20)
CO2: 22 mmol/L (ref 22–32)
Calcium: 8.6 mg/dL — ABNORMAL LOW (ref 8.9–10.3)
Chloride: 97 mmol/L — ABNORMAL LOW (ref 98–111)
Creatinine, Ser: 0.86 mg/dL (ref 0.44–1.00)
GFR, Estimated: >60 mL/min (ref >60)
Glucose, Bld: 141 mg/dL — ABNORMAL HIGH (ref 70–99)
Potassium: 2.8 mmol/L — ABNORMAL LOW (ref 3.5–5.1)
Sodium: 131 mmol/L — ABNORMAL LOW (ref 135–145)
Total Bilirubin: 0.8 mg/dL (ref 0.3–1.2)
Total Protein: 7.2 g/dL (ref 6.5–8.1)

## 2022-06-13 LAB — POC URINE PREG, ED: Preg Test, Ur: NEGATIVE

## 2022-06-13 LAB — LIPASE, BLOOD: Lipase: 28 U/L (ref 11–51)

## 2022-06-13 MED ORDER — CEFDINIR 300 MG PO CAPS: 300.0000 mg | ORAL_CAPSULE | Freq: Two times a day (BID) | ORAL | 0 refills | Status: AC

## 2022-06-13 MED ORDER — SULFAMETHOXAZOLE-TRIMETHOPRIM 800-160 MG PO TABS: 1.0000 | ORAL_TABLET | Freq: Two times a day (BID) | ORAL | 0 refills | Status: DC

## 2022-06-13 MED ORDER — ACETAMINOPHEN 325 MG PO TABS
650.0000 mg | ORAL_TABLET | Freq: Four times a day (QID) | ORAL | Status: DC | PRN
  Administered 2022-06-13: 650 mg via ORAL
  Filled 2022-06-13: qty 2

## 2022-06-13 MED ORDER — SODIUM CHLORIDE 0.9 % IV SOLN
1.0000 g | Freq: Once | INTRAVENOUS | Status: AC
  Administered 2022-06-13: 1 g via INTRAVENOUS
  Filled 2022-06-13: qty 10

## 2022-06-13 MED ORDER — MORPHINE SULFATE (PF) 4 MG/ML IV SOLN
4.0000 mg | Freq: Once | INTRAVENOUS | Status: AC
  Administered 2022-06-13: 4 mg via INTRAVENOUS
  Filled 2022-06-13: qty 1

## 2022-06-13 MED ORDER — POTASSIUM CHLORIDE 10 MEQ/100ML IV SOLN
10.0000 meq | Freq: Once | INTRAVENOUS | Status: AC
Start: 2022-06-13 — End: 2022-06-13
  Administered 2022-06-13: 10 meq via INTRAVENOUS
  Filled 2022-06-13: qty 100

## 2022-06-13 MED ORDER — SODIUM CHLORIDE 0.9 % IV BOLUS
1000.0000 mL | Freq: Once | INTRAVENOUS | Status: AC
  Administered 2022-06-13: 1000 mL via INTRAVENOUS

## 2022-06-13 MED ORDER — ONDANSETRON HCL 4 MG/2ML IJ SOLN
4.0000 mg | Freq: Once | INTRAMUSCULAR | Status: AC
Start: 2022-06-13 — End: 2022-06-13
  Administered 2022-06-13: 4 mg via INTRAVENOUS
  Filled 2022-06-13: qty 2

## 2022-06-13 MED ORDER — ONDANSETRON 4 MG PO TBDP
4.0000 mg | ORAL_TABLET | Freq: Once | ORAL | Status: AC
  Administered 2022-06-13: 4 mg via ORAL
  Filled 2022-06-13: qty 1

## 2022-06-13 MED ORDER — POTASSIUM CHLORIDE 20 MEQ PO PACK
40.0000 meq | PACK | Freq: Once | ORAL | Status: AC
  Administered 2022-06-13: 40 meq via ORAL
  Filled 2022-06-13: qty 2

## 2022-06-13 NOTE — ED Provider Triage Note (Signed)
Emergency Medicine Provider Triage Evaluation Note  Jennifer Escobar , a 22 y.o. female  was evaluated in triage.  Pt complains of crampy right flank pain for the last 2 days with multiple episodes of nonbloody diarrhea yesterday.  Patient denies nausea, vomiting, vaginal bleeding or discharge, dysuria, fever or chills.  Also complaining of pain from her wisdom teeth and states that she was evaluated at the dentist with ibuprofen they gave her is not working.  No recent antibiotics.  Unsure of pregnancy status.  Review of Systems  Positive: See HPI Negative: See HPI  Physical Exam  BP 124/72   Pulse 93   Temp 98.8 F (37.1 C)   Resp 14   Ht 5\' 5"  (1.651 m)   Wt 68 kg   SpO2 99%   BMI 24.96 kg/m  Gen:   Awake, no distress   Resp:  Normal effort  MSK:   Moves extremities without difficulty  Other:  Right CVA tenderness, right upper quadrant and right lower quadrant tenderness, no rebound or guarding  Medical Decision Making  Medically screening exam initiated at 12:47 PM.  Appropriate orders placed.  Devanee Pomplun was informed that the remainder of the evaluation will be completed by another provider, this initial triage assessment does not replace that evaluation, and the importance of remaining in the ED until their evaluation is complete.     Suzzette Righter, PA-C 06/13/22 1248

## 2022-06-13 NOTE — ED Triage Notes (Signed)
Pt arrived POV from home c/o right sided flank pain and RLQ abdominal pain and diarrhea that started on Saturday along with chills and hot sweats. Pt also states her wisdom teeth are bothering her and she went to the dentist but the pain medicine they gave is not helping.

## 2022-06-13 NOTE — ED Notes (Signed)
Lab to add on urine culture

## 2022-06-13 NOTE — ED Provider Notes (Signed)
[facility] Provider Note   CSN: [id number] Arrival date & time: 06/13/22  1232     History  Chief Complaint  Patient presents with   Flank Pain    Jennifer Escobar is a 22 y.o. female.  22 year old female with no past medical history presents to the ED with a chief complaint of right flank pain for the past 2 days.  Reports a sharp stabbing pain to the right flank radiating into the right lower quadrant.  She also endorses 2 episodes of nonbloody diarrhea.  States she is felt some chills, some ongoing pain to her right flank.  She has had some urinary frequency.  She has been taking Tylenol at home without any improvement in symptoms.  She is also complaining of pain to her wisdom teeth in has been taking ibuprofen for this without any improvement in symptoms.  She is sexually active, last actually active approximately a week ago, denies any concern for sexually transmitted infection at this time.  No vomiting, endorsing some nausea, no chest pain, no shortness of breath, no vaginal discharge, no other complaints.   The history is provided by the patient and medical records.  Flank Pain This is a new problem. The current episode started 2 days ago. The problem occurs constantly. The problem has not changed since onset.Pertinent negatives include no chest pain, no abdominal pain and no shortness of breath. Nothing aggravates the symptoms. Nothing relieves the symptoms. She has tried nothing for the symptoms.       Home Medications Prior to Admission medications   Medication Sig Start Date End Date Taking? Authorizing Provider  sulfamethoxazole-trimethoprim (BACTRIM DS) 800-160 MG tablet Take 1 tablet by mouth 2 (two) times daily for 10 days. 06/13/22 06/23/22 Yes Ailed Defibaugh, Beverley Fiedler, PA-C  fluconazole (DIFLUCAN) 150 MG tablet Take 1 tablet (150 mg total) by mouth daily. 04/11/22   [name], PA-C  Levonorgestrel (LILETTA, 52 MG, IU) by Intrauterine  route. 04/24/19   [provider]  naproxen (NAPROSYN) 500 MG tablet Take 1 tablet (500 mg total) by mouth 2 (two) times daily. 04/08/20   Wieters, Hallie C, PA-C      Allergies    Patient has no known allergies.    Review of Systems   Review of Systems  Constitutional:  Positive for chills. Negative for fever.  HENT:  Negative for sore throat.   Respiratory:  Negative for shortness of breath.   Cardiovascular:  Negative for chest pain.  Gastrointestinal:  Positive for nausea. Negative for abdominal pain and vomiting.  Genitourinary:  Positive for flank pain.  Musculoskeletal:  Negative for back pain.  All other systems reviewed and are negative.   Physical Exam Updated Vital Signs BP 124/72   Pulse 93   Temp 98.8 F (37.1 C)   Resp 14   Ht 5\' 5"  (1.651 m)   Wt 68 kg   SpO2 99%   BMI 24.96 kg/m  Physical Exam Vitals and nursing note reviewed.  Constitutional:      General: She is not in acute distress.    Appearance: She is well-developed.  HENT:     Head: Normocephalic and atraumatic.     Mouth/Throat:     Pharynx: No oropharyngeal exudate.  Eyes:     Pupils: Pupils are equal, round, and reactive to light.  Cardiovascular:     Rate and Rhythm: Regular rhythm.     Heart sounds: Normal heart sounds.  Pulmonary:  Effort: Pulmonary effort is normal. No respiratory distress.     Breath sounds: Normal breath sounds.  Abdominal:     General: Bowel sounds are normal. There is no distension.     Palpations: Abdomen is soft.     Tenderness: There is no abdominal tenderness. There is right CVA tenderness and left CVA tenderness.  Musculoskeletal:        General: No tenderness or deformity.     Cervical back: Normal range of motion.     Right lower leg: No edema.     Left lower leg: No edema.  Skin:    General: Skin is warm and dry.  Neurological:     Mental Status: She is alert and oriented to person, place, and time.     ED Results / Procedures /  Treatments   Labs (all labs ordered are listed, but only abnormal results are displayed) Labs Reviewed  CBC WITH DIFFERENTIAL/PLATELET - Abnormal; Notable for the following components:      Result Value   WBC 11.5 (*)    Neutro Abs 9.3 (*)    All other components within normal limits  COMPREHENSIVE METABOLIC PANEL - Abnormal; Notable for the following components:   Sodium 131 (*)    Potassium 2.8 (*)    Chloride 97 (*)    Glucose, Bld 141 (*)    Calcium 8.6 (*)    All other components within normal limits  URINALYSIS, ROUTINE W REFLEX MICROSCOPIC - Abnormal; Notable for the following components:   APPearance CLOUDY (*)    Hgb urine dipstick LARGE (*)    Protein, ur 100 (*)    Leukocytes,Ua LARGE (*)    Bacteria, UA MANY (*)    All other components within normal limits  URINE CULTURE  LIPASE, BLOOD  POC URINE PREG, ED  I-STAT BETA HCG BLOOD, ED ([facility], [facility], [facility] ONLY)    EKG None  Radiology No results found.  Procedures Procedures    Medications Ordered in ED Medications  potassium chloride 10 mEq in 100 mL IVPB (has no administration in time range)  morphine (PF) 4 MG/ML injection 4 mg (has no administration in time range)  cefTRIAXone (ROCEPHIN) 1 g in sodium chloride 0.9 % 100 mL IVPB (has no administration in time range)  sodium chloride 0.9 % bolus 1,000 mL (1,000 mLs Intravenous New Bag/Given 06/13/22 1517)  potassium chloride (KLOR-CON) packet 40 mEq (40 mEq Oral Given 06/13/22 1445)  ondansetron (ZOFRAN) injection 4 mg (4 mg Intravenous Given 06/13/22 1518)    ED Course/ Medical Decision Making/ A&P Clinical Course as of 06/13/22 1519  Mon Jun 13, 2022  1418 Leukocytes,Ua(!): LARGE [JS]  1418 WBC, UA: >50 [JS]  1418 Bacteria, UA(!): MANY [JS]  1512 R flank pain x2 days with diarrhea. Tender on bilateral flanks. Hypo K here today. Does not want a pelvic exam today.  [JS-2]    Clinical Course User Index [JS] Janeece Fitting, PA-C [JS-2] Donzetta Matters, MD                              Medical Decision Making Amount and/or Complexity of Data Reviewed Labs: ordered. Decision-making details documented in ED Course.  Risk Prescription drug management.  This patient presents to the ED for concern of right flank pain, this involves a number of treatment options, and is a complaint that carries with it a high risk of complications and morbidity.  The differential diagnosis includes  pyelonephritis, renal colic, versus cholelithiasis.   Co morbidities: Discussed in HPI   Brief History:  Patient here with 2 days of right flank pain described as sharp stabbing in nature, 2 episodes of diarrhea that been ongoing for the past day.  Has taken some Tylenol without any improvement in symptoms.  No vaginal discharge, no fever, no chills.  EMR reviewed including pt PMHx, past surgical history and past visits to ER.   See HPI for more details   Lab Tests:  I ordered and independently interpreted labs.  The pertinent results include:    Labs notable for CBC with a leukocytosis of 11.5, CMP with hypokalemia, suspect GI etiology.  Creatinine levels within normal limits.  LFTs are unremarkable and there is no pain on palpation with the right upper quadrant. UA with many bacteria, greater than 50 white blood cell count, large leukocytes.  Imaging Studies:  No imaging studies ordered for this patient   Medicines ordered:  I ordered medication including potassium, zofran for hypokalemia and nasuea Reevaluation of the patient after these medicines showed that the patient stayed the same I have reviewed the patients home medicines and have made adjustments as needed  Reevaluation:  After the interventions noted above I re-evaluated patient and found that they have :stayed the same  Social Determinants of Health:  The patient's social determinants of health were a factor in the care of this patient  Problem List / ED Course:  Patient here with right flank  pain along with right lower quadrant pain which began approximately 2 days ago, also endorsing some chills however did not record her temperature at home.  Also reports she is having pain from her wisdom teeth.  Patient endorsing some nausea however has not episodes of vomiting.  Labs are benign with a slight leukocytosis, CMP with some hypokalemia, given IV potassium replacement along with oral potassium.  Creatinine levels within normal limits.  There is right CVA tenderness on my exam.  LFTs are within normal limits, no guarding or pain along the right upper quadrant or Murphys sign.  Her vitals are within normal limit, she is afebrile without any episodes of vomiting here.  UA remarkable for large leukocytes, greater than 50 white blood cell count, many bacteria, she denies any vaginal discharge at this time, sexually active with the same partner for the past several months. NO pain along pelvic area. UA with infectious signs and RIGHT CVA likely suggesting Pyelonephritis versus renal stone.  Patient will need K+ replacement, hydration along with PO trial.   Dispostion:  Patient will likely need potassium around, oral potassium replacement.  Provided antibiotic here and will be prescribed Bactrim to help treat likely pyelonephritis.  She is afebrile here.  Patient care signed out to incoming team.   Portions of this note were generated with Dragon dictation software. Dictation errors may occur despite best attempts at proofreading.   Final Clinical Impression(s) / ED Diagnoses Final diagnoses:  Flank pain  Pyelonephritis    Rx / DC Orders ED Discharge Orders          Ordered    sulfamethoxazole-trimethoprim (BACTRIM DS) 800-160 MG tablet  2 times daily        06/13/22 1510              Janeece Fitting, PA-C 06/13/22 1519    Malvin Johns, MD 06/13/22 1600

## 2022-06-13 NOTE — Discharge Instructions (Addendum)
I have prescribed antibiotics to help treat your infection. Please take 2 tablet twice a day for the next 14 days.  If you experience any worsening symptoms, please return to the ED if you experience any worsening symptoms.  Your CT scan showed that your IUD is slightly out of place in your uterus.  He began having vaginal bleeding or vaginal discharge please return to the emergency department as soon as possible or call your OB in order to set up an appointment.  Regardless you should also follow-up with your OB to let them know you are seen in the emergency department and to let them know this finding.  You begin developing symptoms with the inability to tolerate oral intake with vomiting please return to the emergency department as soon as possible.  Sincerely, Clydie Braun, PGY-2

## 2022-06-13 NOTE — ED Provider Notes (Signed)
Physical Exam  BP 124/72   Pulse 93   Temp 98.8 F (37.1 C)   Resp 14   Ht 5\' 5"  (1.651 m)   Wt 68 kg   SpO2 99%   BMI 24.96 kg/m   Physical Exam Vitals and nursing note reviewed.  Constitutional:      General: She is not in acute distress.    Appearance: She is well-developed.  HENT:     Head: Normocephalic and atraumatic.  Eyes:     Conjunctiva/sclera: Conjunctivae normal.  Cardiovascular:     Rate and Rhythm: Normal rate and regular rhythm.     Heart sounds: No murmur heard. Pulmonary:     Effort: Pulmonary effort is normal. No respiratory distress.     Breath sounds: Normal breath sounds.  Abdominal:     Palpations: Abdomen is soft.     Tenderness: There is no abdominal tenderness.  Musculoskeletal:        General: No swelling.     Cervical back: Neck supple.  Skin:    General: Skin is warm and dry.     Capillary Refill: Capillary refill takes less than 2 seconds.  Neurological:     Mental Status: She is alert.  Psychiatric:        Mood and Affect: Mood normal.     Procedures  Procedures  ED Course / MDM   Clinical Course as of 06/13/22 1515  Mon Jun 13, 2022  1418 Leukocytes,Ua(!): LARGE [JS]  1418 WBC, UA: >50 [JS]  1418 Bacteria, UA(!): MANY [JS]  1512 R flank pain x2 days with diarrhea. Tender on bilateral flanks. Hypo K here today. Does not want a pelvic exam today.  [JS-2]    Clinical Course User Index [JS] Janeece Fitting, PA-C [JS-2] Donzetta Matters, MD   Medical Decision Making On assuming care of this patient she was reevaluated noted to have a slight fever to 100.8 I gave her 650 mg of Tylenol.  Given patient's workup it appears that she is suffering from pyelonephritis given her acute UTI with elevated white blood cell count and fever along with nausea.  I wanted to rule out an infected kidney stone so I got a CT renal study which showed no evidence of an acute stone did show enteritis and a malpositioned IUD.  Patient denies vaginal  discharge or vaginal bleeding or pelvic tenderness she states that her pain is mostly in her flanks therefore I am less concerned for PID or that her IUD is symptomatic.  I did share decision-making with the patient who stated that she will follow-up with her OB/GYN for IUD readjustment.  Patient maintained a fever in the emergency department despite antipyretics however she was not tachycardic nor hypotensive she was able to tolerate p.o. intake without difficulty her potassium was repleted in the emergency department and she tolerated her ceftriaxone without any difficulty she was able to tolerate p.o. and has no other medical comorbidities therefore I feel that she is suitable for outpatient pyelonephritis workup.  She was discharged with cefdinir 300 mg twice daily for 14 days.  I told her to return to the emergency department in 48 hours should she not begin having clinical improvement.  Patient was in agreement this plan and discharged home self-care.  Amount and/or Complexity of Data Reviewed Labs: ordered. Decision-making details documented in ED Course. Radiology: ordered.  Risk OTC drugs. Prescription drug management.          Donzetta Matters, MD 06/13/22 2350  Carmin Muskrat, MD 06/14/22 [phone]

## 2022-06-13 NOTE — ED Notes (Signed)
Pt verbalizes understanding of discharge instructions. Opportunity for questions and answers were provided. Pt discharged from the ED.   ?

## 2022-06-15 LAB — URINE CULTURE: Culture: >=100000 — AB

## 2022-06-16 ENCOUNTER — Telehealth (HOSPITAL_BASED_OUTPATIENT_CLINIC_OR_DEPARTMENT_OTHER): Payer: Self-pay | Admitting: *Deleted

## 2022-06-16 NOTE — Telephone Encounter (Signed)
Post ED Visit - Positive Culture Follow-up  Culture report reviewed by antimicrobial stewardship pharmacist: [facility] Team []  Elenor Quinones, Pharm.D. []  Heide Guile, Pharm.D., BCPS AQ-ID []  Parks Neptune, Pharm.D., BCPS []  Alycia Rossetti, Pharm.D., BCPS []  Rapids, Pharm.D., BCPS, AAHIVP []  Legrand Como, Pharm.D., BCPS, AAHIVP []  Salome Arnt, PharmD, BCPS []  Johnnette Gourd, PharmD, BCPS []  Hughes Better, PharmD, BCPS []  Leeroy Cha, PharmD []  Laqueta Linden, PharmD, BCPS []  Albertina Parr, PharmD  [facility] Team []  Leodis Sias, PharmD []  Lindell Spar, PharmD []  Royetta Asal, PharmD []  Graylin Shiver, Rph []  Rema Fendt) Glennon Mac, PharmD []  Arlyn Dunning, PharmD []  Netta Cedars, PharmD []  Dia Sitter, PharmD []  Leone Haven, PharmD []  Gretta Arab, PharmD []  Theodis Shove, PharmD []  Peggyann Juba, PharmD []  Reuel Boom, PharmD   Positive urine culture Treated with Cefdinir, organism sensitive to the same and no further patient follow-up is required at this time.  Mal Misty Dohler, Pharm D  Harlon Flor Landfall 06/16/2022, 10:01 AM

## 2022-06-20 ENCOUNTER — Ambulatory Visit
Admission: EM | Admit: 2022-06-20 | Discharge: 2022-06-20 | Disposition: A | Discharge status: Home / Self Care | Payer: Medicaid Other | Attending: Physician Assistant | Admitting: Physician Assistant

## 2022-06-20 DIAGNOSIS — N760 Acute vaginitis: Secondary | ICD-10-CM | POA: Insufficient documentation

## 2022-06-20 MED ORDER — FLUCONAZOLE 150 MG PO TABS: ORAL_TABLET | ORAL | 0 refills | Status: AC

## 2022-06-20 NOTE — ED Triage Notes (Signed)
Pt c/o vaginal discharge and irritation, onset ~ Friday. States recently had a uti and kidney infection.

## 2022-06-20 NOTE — ED Provider Notes (Signed)
[name]    CSN: [id number] Arrival date & time: 06/20/22  0800      History   Chief Complaint Chief Complaint  Patient presents with   Vaginal Discharge    HPI Jennifer Escobar is a 22 y.o. female.   Patient here today for evaluation of vaginal discharge that started after being treated for UTI. She reports some itching. She notes discharge is white. UTI symptoms have cleared and she denies any dysuria.   The history is provided by the patient.  Vaginal Discharge Associated symptoms: no dysuria and no fever     Past Medical History:  Diagnosis Date   Antepartum fetal growth retardation 04/23/2019   IUGR (intrauterine growth restriction) affecting care of mother 04/18/2019   Medical history non-contributory    Rubella non-immune status, antepartum 11/01/2018   MMR pp   Supervision of high risk pregnancy, antepartum 10/31/2018    Nursing Staff Provider Office Location  [place]  Dating   LMP Language  ENGLISH  Anatomy US  12/20/2018-WNL Flu Vaccine  Declined 02/13/2019 Genetic Screen  NIPS: ordered   AFP:     TDaP vaccine   02/13/2019 Hgb A1C or  GTT Early  Third trimester WNL Rhogam     LAB RESULTS  Feeding Plan Undecided, ?breast Blood Type B/Positive/-- (06/24 1058)  Contraception LnIUD PP Antibody Negative (06/24 1058) Cir   SVD (spontaneous vaginal delivery) 04/24/2019    Patient Active Problem List   Diagnosis Date Noted   IUD (intrauterine device) in place 04/24/2019    Past Surgical History:  Procedure Laterality Date   NO PAST SURGERIES      OB History     Gravida  1   Para  1   Term  1   Preterm      AB      Living  1      SAB      IAB      Ectopic      Multiple  0   Live Births  1            Home Medications    Prior to Admission medications   Medication Sig Start Date End Date Taking? Authorizing Provider  fluconazole (DIFLUCAN) 150 MG tablet Take one tab PO today and repeat dose in 3 days if symptoms persist 06/20/22   Yes Francene Finders, PA-C  cefdinir (OMNICEF) 300 MG capsule Take 1 capsule (300 mg total) by mouth 2 (two) times daily for 14 days. 06/13/22 06/27/22  Donzetta Matters, MD  Levonorgestrel (LILETTA, 52 MG, IU) by Intrauterine route. 04/24/19   [provider]    Family History Family History  Problem Relation Age of Onset   Healthy Mother    Healthy Father     Social History Social History   Tobacco Use   Smoking status: Never   Smokeless tobacco: Never  Vaping Use   Vaping Use: Never used  Substance Use Topics   Alcohol use: Never   Drug use: Never     Allergies   Patient has no known allergies.   Review of Systems Review of Systems  Constitutional:  Negative for chills and fever.  Eyes:  Negative for discharge and redness.  Respiratory:  Negative for shortness of breath.   Genitourinary:  Positive for vaginal discharge. Negative for dysuria.     Physical Exam Triage Vital Signs ED Triage Vitals  Enc Vitals Group     BP 06/20/22 0830 128/78  Pulse Rate 06/20/22 0830 98     Resp 06/20/22 0830 16     Temp 06/20/22 0830 (!) 97.5 F (36.4 C)     Temp Source 06/20/22 0830 Oral     SpO2 06/20/22 0830 98 %     Weight --      Height --      Head Circumference --      Peak Flow --      Pain Score 06/20/22 0831 0     Pain Loc --      Pain Edu? --      Excl. in [place]? --    No data found.  Updated Vital Signs BP 128/78 (BP Location: Left Arm)   Pulse 98   Temp (!) 97.5 F (36.4 C) (Oral)   Resp 16   SpO2 98%      Physical Exam Vitals and nursing note reviewed.  Constitutional:      General: She is not in acute distress.    Appearance: Normal appearance. She is not ill-appearing.  HENT:     Head: Normocephalic and atraumatic.     Nose: No congestion.  Eyes:     Conjunctiva/sclera: Conjunctivae normal.  Cardiovascular:     Rate and Rhythm: Normal rate.  Pulmonary:     Effort: Pulmonary effort is normal. No respiratory distress.   Neurological:     Mental Status: She is alert.  Psychiatric:        Mood and Affect: Mood normal.        Behavior: Behavior normal.      UC Treatments / Results  Labs (all labs ordered are listed, but only abnormal results are displayed) Labs Reviewed  CERVICOVAGINAL ANCILLARY ONLY    EKG   Radiology No results found.  Procedures Procedures (including critical care time)  Medications Ordered in UC Medications - No data to display  Initial Impression / Assessment and Plan / UC Course  I have reviewed the triage vital signs and the nursing notes.  Pertinent labs & imaging results that were available during my care of the patient were reviewed by me and considered in my medical decision making (see chart for details).    Diflucan prescribed for treatment of suspected yeast vaginitis and will screen for yeast/ BV and STDs. Encouraged follow up if no gradual improvement or with any further concerns.   Final Clinical Impressions(s) / UC Diagnoses   Final diagnoses:  Acute vaginitis   Discharge Instructions   None    ED Prescriptions     Medication Sig Dispense Auth. Provider   fluconazole (DIFLUCAN) 150 MG tablet Take one tab PO today and repeat dose in 3 days if symptoms persist 2 tablet Francene Finders, PA-C      PDMP not reviewed this encounter.   Francene Finders, PA-C 06/20/22 [phone]

## 2022-06-21 LAB — CERVICOVAGINAL ANCILLARY ONLY
Bacterial Vaginitis (gardnerella): POSITIVE — AB
Candida Glabrata: NEGATIVE
Candida Vaginitis: POSITIVE — AB
Chlamydia: NEGATIVE
Comment: NEGATIVE
Comment: NEGATIVE
Comment: NEGATIVE
Comment: NEGATIVE
Comment: NEGATIVE
Comment: NORMAL
Neisseria Gonorrhea: NEGATIVE
Trichomonas: POSITIVE — AB

## 2022-06-22 ENCOUNTER — Telehealth (HOSPITAL_COMMUNITY): Payer: Self-pay | Admitting: Emergency Medicine

## 2022-06-22 MED ORDER — METRONIDAZOLE 500 MG PO TABS: 500.0000 mg | ORAL_TABLET | Freq: Two times a day (BID) | ORAL | 0 refills | Status: AC

## 2022-09-06 ENCOUNTER — Ambulatory Visit
Admission: EM | Admit: 2022-09-06 | Discharge: 2022-09-06 | Disposition: A | Discharge status: Home / Self Care | Payer: Medicaid Other | Attending: Internal Medicine | Admitting: Internal Medicine

## 2022-09-06 DIAGNOSIS — L02412 Cutaneous abscess of left axilla: Secondary | ICD-10-CM | POA: Insufficient documentation

## 2022-09-06 DIAGNOSIS — Z113 Encounter for screening for infections with a predominantly sexual mode of transmission: Secondary | ICD-10-CM | POA: Diagnosis not present

## 2022-09-06 LAB — POCT URINE PREGNANCY: Preg Test, Ur: NEGATIVE

## 2022-09-06 MED ORDER — DOXYCYCLINE HYCLATE 100 MG PO CAPS: 100.0000 mg | ORAL_CAPSULE | Freq: Two times a day (BID) | ORAL | 0 refills | Status: AC

## 2022-09-06 NOTE — ED Triage Notes (Signed)
Pt states she wants to be tested for STD's denies any symptoms.  Also states she has an abscess under her left armpit.

## 2022-09-06 NOTE — Discharge Instructions (Signed)
I have prescribed you an antibiotic for the abscess to your left armpit.  Take this with food to avoid stomach upset.  Use warm compresses.  Follow-up with dermatology at provided contact information.  Urine pregnancy test was negative.  STD testing is pending.  Will call if it is positive and treat as appropriate.  Follow-up if any symptoms persist or worsen.

## 2022-09-06 NOTE — ED Notes (Signed)
Pt refused to have her blood drawn for STI testing,Haley NP aware and orders cancelled.

## 2022-09-06 NOTE — ED Provider Notes (Signed)
[name]    CSN: [id number] Arrival date & time: 09/06/22  0827      History   Chief Complaint Chief Complaint  Patient presents with   Exposure to STD    HPI Jennifer Escobar is a 22 y.o. female.   Patient presents with 2 different chief complaints today.  Patient reports that she has an abscess like lesion in the left axilla.  She states that this has been present for over a year.  She has been evaluated for it when it first started and was prescribed antibiotics with resolution.  Then, it returned.  She reports that she is currently having purulent drainage from the area.  States that it "moves around her armpit".  Has never been evaluated by dermatology.  Denies any associated fever.  Also requesting STD testing.  Patient denies any associated symptoms or any exposure.  Last menstrual cycle is unknown but she thinks it may have been in February.  Reports this is baseline for her given that she has an IUD in place.   Exposure to STD    Past Medical History:  Diagnosis Date   Antepartum fetal growth retardation 04/23/2019   IUGR (intrauterine growth restriction) affecting care of mother 04/18/2019   Medical history non-contributory    Rubella non-immune status, antepartum 11/01/2018   MMR pp   Supervision of high risk pregnancy, antepartum 10/31/2018    Nursing Staff Provider Office Location  [place]  Dating   LMP Language  ENGLISH  Anatomy US  12/20/2018-WNL Flu Vaccine  Declined 02/13/2019 Genetic Screen  NIPS: ordered   AFP:     TDaP vaccine   02/13/2019 Hgb A1C or  GTT Early  Third trimester WNL Rhogam     LAB RESULTS  Feeding Plan Undecided, ?breast Blood Type B/Positive/-- (06/24 1058)  Contraception LnIUD PP Antibody Negative (06/24 1058) Cir   SVD (spontaneous vaginal delivery) 04/24/2019    Patient Active Problem List   Diagnosis Date Noted   IUD (intrauterine device) in place 04/24/2019    Past Surgical History:  Procedure Laterality Date   NO PAST  SURGERIES      OB History     Gravida  1   Para  1   Term  1   Preterm      AB      Living  1      SAB      IAB      Ectopic      Multiple  0   Live Births  1            Home Medications    Prior to Admission medications   Medication Sig Start Date End Date Taking? Authorizing Provider  doxycycline (VIBRAMYCIN) 100 MG capsule Take 1 capsule (100 mg total) by mouth 2 (two) times daily. 09/06/22  Yes Joakim Huesman, Acie Fredrickson, FNP  fluconazole (DIFLUCAN) 150 MG tablet Take one tab PO today and repeat dose in 3 days if symptoms persist 06/20/22   Tomi Bamberger, PA-C  Levonorgestrel (LILETTA, 52 MG, IU) by Intrauterine route. 04/24/19   [provider]  metroNIDAZOLE (FLAGYL) 500 MG tablet Take 1 tablet (500 mg total) by mouth 2 (two) times daily. 06/22/22   Lamptey, Britta Mccreedy, MD    Family History Family History  Problem Relation Age of Onset   Healthy Mother    Healthy Father     Social History Social History   Tobacco Use   Smoking status: Never  Smokeless tobacco: Never  Vaping Use   Vaping Use: Never used  Substance Use Topics   Alcohol use: Never   Drug use: Never     Allergies   Patient has no known allergies.   Review of Systems Review of Systems Per HPI  Physical Exam Triage Vital Signs ED Triage Vitals  Enc Vitals Group     BP 09/06/22 0848 119/80     Pulse Rate 09/06/22 0848 61     Resp 09/06/22 0848 16     Temp 09/06/22 0848 98 F (36.7 C)     Temp Source 09/06/22 0848 Oral     SpO2 09/06/22 0848 98 %     Weight --      Height --      Head Circumference --      Peak Flow --      Pain Score 09/06/22 0850 0     Pain Loc --      Pain Edu? --      Excl. in [place]? --    No data found.  Updated Vital Signs BP 119/80 (BP Location: Left Arm)   Pulse 61   Temp 98 F (36.7 C) (Oral)   Resp 16   LMP  (LMP Unknown)   SpO2 98%   Visual Acuity Right Eye Distance:   Left Eye Distance:   Bilateral Distance:    Right Eye  Near:   Left Eye Near:    Bilateral Near:     Physical Exam Constitutional:      General: She is not in acute distress.    Appearance: Normal appearance. She is not toxic-appearing or diaphoretic.  HENT:     Head: Normocephalic and atraumatic.  Eyes:     Extraocular Movements: Extraocular movements intact.     Conjunctiva/sclera: Conjunctivae normal.  Pulmonary:     Effort: Pulmonary effort is normal.  Genitourinary:    Comments: Deferred with shared decision making. Self swab performed.  Skin:    Comments: Has a flat area of induration present to the left upper axilla.  She does have some purulent drainage coming from the center as well.  No area of fluctuance.  Lymph nodes appear normal.  Neurological:     General: No focal deficit present.     Mental Status: She is alert and oriented to person, place, and time. Mental status is at baseline.  Psychiatric:        Mood and Affect: Mood normal.        Behavior: Behavior normal.        Thought Content: Thought content normal.        Judgment: Judgment normal.      UC Treatments / Results  Labs (all labs ordered are listed, but only abnormal results are displayed) Labs Reviewed  POCT URINE PREGNANCY  CERVICOVAGINAL ANCILLARY ONLY    EKG   Radiology No results found.  Procedures Procedures (including critical care time)  Medications Ordered in UC Medications - No data to display  Initial Impression / Assessment and Plan / UC Course  I have reviewed the triage vital signs and the nursing notes.  Pertinent labs & imaging results that were available during my care of the patient were reviewed by me and considered in my medical decision making (see chart for details).     1.  Left axillary abscess  Has abscess to left axilla with purulent drainage.  It is very indurated and no I&D is necessary at this time.  Will treat with doxycycline and given recurrence and duration of time since it started, I do recommend the  patient see dermatology so she was provided with contact information for this.  Advised warm compresses as well.  Advised follow-up if symptoms persist or worsen.  2.  STD testing  Here for routine testing so cervicovaginal swab pending. Patient declined HIV and RPR. Given no exposure, will await results for any treatment if necessary.  Urine pregnancy test completed given patient was not sure when last menstrual cycle was which was negative.  Patient to refrain from sexual activity until test results and treatment are complete.  Discussed return precautions.  Patient verbalized understanding and was agreeable with plan. Final Clinical Impressions(s) / UC Diagnoses   Final diagnoses:  Abscess of left axilla  Screening examination for venereal disease     Discharge Instructions      I have prescribed you an antibiotic for the abscess to your left armpit.  Take this with food to avoid stomach upset.  Use warm compresses.  Follow-up with dermatology at provided contact information.  Urine pregnancy test was negative.  STD testing is pending.  Will call if it is positive and treat as appropriate.  Follow-up if any symptoms persist or worsen.    ED Prescriptions     Medication Sig Dispense Auth. Provider   doxycycline (VIBRAMYCIN) 100 MG capsule Take 1 capsule (100 mg total) by mouth 2 (two) times daily. 20 capsule Gustavus Bryant, [place]      PDMP not reviewed this encounter.   Gustavus Bryant, [place] 09/06/22 [phone]

## 2022-09-07 LAB — CERVICOVAGINAL ANCILLARY ONLY
Bacterial Vaginitis (gardnerella): NEGATIVE
Candida Glabrata: NEGATIVE
Candida Vaginitis: NEGATIVE
Chlamydia: NEGATIVE
Comment: NEGATIVE
Comment: NEGATIVE
Comment: NEGATIVE
Comment: NEGATIVE
Comment: NEGATIVE
Comment: NORMAL
Neisseria Gonorrhea: NEGATIVE
Trichomonas: NEGATIVE

## 2023-10-11 ENCOUNTER — Ambulatory Visit

## 2023-10-11 ENCOUNTER — Inpatient Hospital Stay
Admission: RE | Admit: 2023-10-11 | Discharge: 2023-10-11 | Disposition: A | Discharge status: Home / Self Care | Payer: Self-pay | Source: Ambulatory Visit

## 2023-10-12 ENCOUNTER — Ambulatory Visit
Admission: RE | Admit: 2023-10-12 | Discharge: 2023-10-12 | Disposition: A | Discharge status: Home / Self Care | Payer: Self-pay | Source: Ambulatory Visit | Attending: Physician Assistant | Admitting: Physician Assistant

## 2023-10-12 ENCOUNTER — Other Ambulatory Visit: Payer: Self-pay

## 2023-10-12 VITALS — BP 115/73 | HR 84 | Temp 97.7°F | Resp 18 | Ht 65.0 in | Wt 170.0 lb

## 2023-10-12 DIAGNOSIS — R8281 Pyuria: Secondary | ICD-10-CM | POA: Insufficient documentation

## 2023-10-12 DIAGNOSIS — N898 Other specified noninflammatory disorders of vagina: Secondary | ICD-10-CM | POA: Insufficient documentation

## 2023-10-12 LAB — POCT URINALYSIS DIP (MANUAL ENTRY)
Bilirubin, UA: NEGATIVE
Glucose, UA: NEGATIVE mg/dL
Ketones, POC UA: NEGATIVE mg/dL
Nitrite, UA: NEGATIVE
Protein Ur, POC: NEGATIVE mg/dL
Spec Grav, UA: 1.02 (ref 1.010–1.025)
Urobilinogen, UA: 1 U/dL
pH, UA: 7.5 (ref 5.0–8.0)

## 2023-10-12 LAB — POCT URINE PREGNANCY: Preg Test, Ur: NEGATIVE

## 2023-10-12 NOTE — ED Triage Notes (Signed)
 Pt presents with complaints of vaginal discharge with slight itching x 2 weeks. Pt currently denies pain. Denies taking or applying OTC medications PTA. States the vaginal discharge does have an unusual odor to it.

## 2023-10-12 NOTE — ED Provider Notes (Signed)
 Jennifer Escobar    CSN: [id number] Arrival date & time: 10/12/23  1434      History   Chief Complaint Chief Complaint  Patient presents with   Vaginal Discharge    .. - Entered by patient    HPI Jennifer Escobar is a 23 y.o. female.   HPI Pt reports concerns for vaginal discharge changes for about 2-3 weeks She denies abdominal pain, pelvic pain, fever, chills, dysuria Interventions: none  She denies concerns for STD infection but is amenable to testing today     Past Medical History:  Diagnosis Date   Antepartum fetal growth retardation 04/23/2019   IUGR (intrauterine growth restriction) affecting care of mother 04/18/2019   Medical history non-contributory    Rubella non-immune status, antepartum 11/01/2018   MMR pp   Supervision of high risk pregnancy, antepartum 10/31/2018    Nursing Staff Provider Office Location  [place]  Dating   LMP Language  ENGLISH  Anatomy US   12/20/2018-WNL Flu Vaccine  Declined 02/13/2019 Genetic Screen  NIPS: ordered   AFP:     TDaP vaccine   02/13/2019 Hgb A1C or  GTT Early  Third trimester WNL Rhogam     LAB RESULTS  Feeding Plan Undecided, ?breast Blood Type B/Positive/-- (06/24 1058)  Contraception LnIUD PP Antibody Negative (06/24 1058) Cir   SVD (spontaneous vaginal delivery) 04/24/2019    Patient Active Problem List   Diagnosis Date Noted   IUD (intrauterine device) in place 04/24/2019    Past Surgical History:  Procedure Laterality Date   NO PAST SURGERIES      OB History     Gravida  1   Para  1   Term  1   Preterm      AB      Living  1      SAB      IAB      Ectopic      Multiple  0   Live Births  1            Home Medications    Prior to Admission medications   Medication Sig Start Date End Date Taking? Authorizing Provider  doxycycline  (VIBRAMYCIN ) 100 MG capsule Take 1 capsule (100 mg total) by mouth 2 (two) times daily. 09/06/22   Dodson Freestone, FNP  fluconazole  (DIFLUCAN ) 150 MG tablet Take  one tab PO today and repeat dose in 3 days if symptoms persist 06/20/22   Vernestine Gondola, PA-C  Levonorgestrel  (LILETTA , 52 MG, IU) by Intrauterine route. 04/24/19   [provider]  metroNIDAZOLE  (FLAGYL ) 500 MG tablet Take 1 tablet (500 mg total) by mouth 2 (two) times daily. 06/22/22   Lamptey, Donley Furth, MD    Family History Family History  Problem Relation Age of Onset   Healthy Mother    Healthy Father     Social History Social History   Tobacco Use   Smoking status: Never   Smokeless tobacco: Never  Vaping Use   Vaping status: Never Used  Substance Use Topics   Alcohol use: Never   Drug use: Never     Allergies   Patient has no known allergies.   Review of Systems Review of Systems  Constitutional:  Negative for chills and fever.  Gastrointestinal:  Negative for abdominal pain, nausea and vomiting.  Genitourinary:  Positive for frequency and vaginal discharge. Negative for difficulty urinating, dysuria, pelvic pain, vaginal bleeding and vaginal pain.     Physical Exam Triage Vital  Signs ED Triage Vitals  Encounter Vitals Group     BP 10/12/23 1521 115/73     Systolic BP Percentile --      Diastolic BP Percentile --      Pulse Rate 10/12/23 1521 84     Resp 10/12/23 1521 18     Temp 10/12/23 1521 97.7 F (36.5 C)     Temp Source 10/12/23 1521 Oral     SpO2 10/12/23 1521 99 %     Weight 10/12/23 1526 170 lb (77.1 kg)     Height 10/12/23 1526 5\' 5"  (1.651 m)     Head Circumference --      Peak Flow --      Pain Score 10/12/23 1526 0     Pain Loc --      Pain Education --      Exclude from Growth Chart --    No data found.  Updated Vital Signs BP 115/73 (BP Location: Right Arm)   Pulse 84   Temp 97.7 F (36.5 C) (Oral)   Resp 18   Ht 5\' 5"  (1.651 m)   Wt 170 lb (77.1 kg)   LMP  (Within Weeks)   SpO2 99%   BMI 28.29 kg/m   Visual Acuity Right Eye Distance:   Left Eye Distance:   Bilateral Distance:    Right Eye Near:   Left  Eye Near:    Bilateral Near:     Physical Exam Vitals reviewed.  Constitutional:      General: She is awake.     Appearance: Normal appearance. She is well-developed and well-groomed.  HENT:     Head: Normocephalic and atraumatic.  Eyes:     General: Lids are normal. Gaze aligned appropriately.     Extraocular Movements: Extraocular movements intact.     Conjunctiva/sclera: Conjunctivae normal.  Pulmonary:     Effort: Pulmonary effort is normal.  Musculoskeletal:     Cervical back: Normal range of motion.  Neurological:     General: No focal deficit present.     Mental Status: She is alert and oriented to person, place, and time.     GCS: GCS eye subscore is 4. GCS verbal subscore is 5. GCS motor subscore is 6.     Cranial Nerves: No cranial nerve deficit, dysarthria or facial asymmetry.  Psychiatric:        Attention and Perception: Attention and perception normal.        Mood and Affect: Mood and affect normal.        Speech: Speech normal.        Behavior: Behavior normal. Behavior is cooperative.      [facility] Treatments / Results  Labs (all labs ordered are listed, but only abnormal results are displayed) Labs Reviewed  POCT URINALYSIS DIP (MANUAL ENTRY) - Abnormal; Notable for the following components:      Result Value   Blood, UA trace-intact (*)    Leukocytes, UA Trace (*)    All other components within normal limits  URINE CULTURE  POCT URINE PREGNANCY  CERVICOVAGINAL ANCILLARY ONLY    EKG   Radiology No results found.  Procedures Procedures (including critical care time)  Medications Ordered in [facility] Medications - No data to display  Initial Impression / Assessment and Plan / [facility] Course  I have reviewed the triage vital signs and the nursing notes.  Pertinent labs & imaging results that were available during my care of the patient were reviewed by me and  considered in my medical decision making (see chart for details).      Final Clinical  Impressions(s) / [facility] Diagnoses   Final diagnoses:  Vaginal discharge  Pyuria   Patient presents today with concerns for overall vaginal discharge changes for the last 2 to 3 weeks.  She denies dysuria, abdominal pain, fever or chills, pelvic pain.  Urine dip is notable for trace leukocytes and RBCs.  Will send sample for urine culture for definitive rule out as I am unsure if this is contamination from vulvovaginal process versus true UTI.  Cervicovaginal swab collected today to assess for BV, trichomonas, yeast, gonorrhea, chlamydia.  Results to dictate further management.  Follow-up as needed or indicated by test results    Discharge Instructions      You were seen today for concerns of vaginal discharge changes.  We also collected a urine dip which showed a trace amount of red blood cells as well as white blood cells.  This is sometimes consistent with a UTI but can be contamination from the vaginal discharge changes.  I have sent a sample of your urine off for culture to definitively rule out a UTI.  We will keep you updated with those results and we will send in any medication that may be indicated by those results to the pharmacy that we have on file.   We collected a cervicovaginal swab that we will assess for gonorrhea, chlamydia, trichomonas, bacterial vaginosis, yeast.   We will keep you updated with these results once they are available.  If any medications are indicated by those test results we will call you and medications will either be sent to the pharmacy on file or you can return to the urgent care for an injection.  It is recommended that you refrain from sexual activity until your test results are negative or until you have completed an appropriate medication regimen as dictated by your test results.  Please use a condom or another barrier method to help prevent STD transmission.  Please make sure that you communicate with your partners regarding your test results should any positive  results, about as they will also need to be tested and screened.    ED Prescriptions   None    PDMP not reviewed this encounter.   Jerona Mooring, PA-C 10/12/23 [id number]

## 2023-10-12 NOTE — Discharge Instructions (Addendum)
 You were seen today for concerns of vaginal discharge changes.  We also collected a urine dip which showed a trace amount of red blood cells as well as white blood cells.  This is sometimes consistent with a UTI but can be contamination from the vaginal discharge changes.  I have sent a sample of your urine off for culture to definitively rule out a UTI.  We will keep you updated with those results and we will send in any medication that may be indicated by those results to the pharmacy that we have on file.   We collected a cervicovaginal swab that we will assess for gonorrhea, chlamydia, trichomonas, bacterial vaginosis, yeast.   We will keep you updated with these results once they are available.  If any medications are indicated by those test results we will call you and medications will either be sent to the pharmacy on file or you can return to the urgent care for an injection.  It is recommended that you refrain from sexual activity until your test results are negative or until you have completed an appropriate medication regimen as dictated by your test results.  Please use a condom or another barrier method to help prevent STD transmission.  Please make sure that you communicate with your partners regarding your test results should any positive results, about as they will also need to be tested and screened.

## 2023-10-13 LAB — CERVICOVAGINAL ANCILLARY ONLY
Bacterial Vaginitis (gardnerella): POSITIVE — AB
Candida Glabrata: NEGATIVE
Candida Vaginitis: NEGATIVE
Chlamydia: NEGATIVE
Comment: NEGATIVE
Comment: NEGATIVE
Comment: NEGATIVE
Comment: NEGATIVE
Comment: NEGATIVE
Comment: NORMAL
Neisseria Gonorrhea: NEGATIVE
Trichomonas: NEGATIVE

## 2023-10-14 LAB — URINE CULTURE: Culture: 50000 — AB

## 2023-10-16 ENCOUNTER — Ambulatory Visit (HOSPITAL_COMMUNITY): Payer: Self-pay

## 2023-10-16 MED ORDER — NITROFURANTOIN MONOHYD MACRO 100 MG PO CAPS: 100.0000 mg | ORAL_CAPSULE | Freq: Two times a day (BID) | ORAL | 0 refills | Status: AC

## 2023-10-16 MED ORDER — METRONIDAZOLE 0.75 % VA GEL: 1.0000 | Freq: Every day | VAGINAL | 0 refills | Status: AC

## 2024-06-07 ENCOUNTER — Ambulatory Visit: Payer: Self-pay | Admitting: Physician Assistant
